# Patient Record
Sex: Female | Born: 1965 | Race: White | Hispanic: No | Smoking: Never smoker
Health system: Southern US, Community
[De-identification: ages and names within clinical notes are randomized; demographics above are authoritative.]

## PROBLEM LIST (undated history)

## (undated) DIAGNOSIS — O24419 Gestational diabetes mellitus in pregnancy, unspecified control: Secondary | ICD-10-CM

## (undated) DIAGNOSIS — T7840XA Allergy, unspecified, initial encounter: Secondary | ICD-10-CM

## (undated) DIAGNOSIS — E559 Vitamin D deficiency, unspecified: Secondary | ICD-10-CM

## (undated) DIAGNOSIS — E7800 Pure hypercholesterolemia, unspecified: Secondary | ICD-10-CM

## (undated) HISTORY — PX: CHOLECYSTECTOMY: SHX55

## (undated) HISTORY — DX: Vitamin D deficiency, unspecified: E55.9

## (undated) HISTORY — DX: Gestational diabetes mellitus in pregnancy, unspecified control: O24.419

## (undated) HISTORY — DX: Allergy, unspecified, initial encounter: T78.40XA

## (undated) HISTORY — PX: OVARIAN CYST REMOVAL: SHX89

## (undated) HISTORY — DX: Pure hypercholesterolemia, unspecified: E78.00

---

## 1998-10-16 ENCOUNTER — Emergency Department (HOSPITAL_COMMUNITY): Admission: EM | Admit: 1998-10-16 | Discharge: 1998-10-17 | Payer: Self-pay | Admitting: Emergency Medicine

## 1998-10-16 ENCOUNTER — Encounter: Payer: Self-pay | Admitting: Emergency Medicine

## 1998-10-18 ENCOUNTER — Inpatient Hospital Stay (HOSPITAL_COMMUNITY): Admission: AD | Admit: 1998-10-18 | Discharge: 1998-10-22 | Payer: Self-pay | Admitting: Psychiatry

## 1999-02-19 ENCOUNTER — Other Ambulatory Visit: Admission: RE | Admit: 1999-02-19 | Discharge: 1999-02-19 | Payer: Self-pay | Admitting: Obstetrics and Gynecology

## 1999-05-13 ENCOUNTER — Emergency Department (HOSPITAL_COMMUNITY): Admission: EM | Admit: 1999-05-13 | Discharge: 1999-05-13 | Payer: Self-pay | Admitting: Emergency Medicine

## 2001-09-14 ENCOUNTER — Other Ambulatory Visit: Admission: RE | Admit: 2001-09-14 | Discharge: 2001-09-14 | Payer: Self-pay | Admitting: Obstetrics and Gynecology

## 2003-04-30 DIAGNOSIS — O24419 Gestational diabetes mellitus in pregnancy, unspecified control: Secondary | ICD-10-CM

## 2003-04-30 HISTORY — DX: Gestational diabetes mellitus in pregnancy, unspecified control: O24.419

## 2003-06-04 ENCOUNTER — Inpatient Hospital Stay (HOSPITAL_COMMUNITY): Admission: AD | Admit: 2003-06-04 | Discharge: 2003-06-04 | Payer: Self-pay | Admitting: Gynecology

## 2003-07-21 ENCOUNTER — Other Ambulatory Visit: Admission: RE | Admit: 2003-07-21 | Discharge: 2003-07-21 | Payer: Self-pay | Admitting: Obstetrics and Gynecology

## 2003-07-30 ENCOUNTER — Inpatient Hospital Stay (HOSPITAL_COMMUNITY): Admission: AD | Admit: 2003-07-30 | Discharge: 2003-07-30 | Payer: Self-pay | Admitting: Obstetrics and Gynecology

## 2004-02-04 ENCOUNTER — Inpatient Hospital Stay (HOSPITAL_COMMUNITY): Admission: RE | Admit: 2004-02-04 | Discharge: 2004-02-04 | Payer: Self-pay | Admitting: Obstetrics and Gynecology

## 2004-07-31 ENCOUNTER — Encounter: Admission: RE | Admit: 2004-07-31 | Discharge: 2004-07-31 | Payer: Self-pay | Admitting: Obstetrics and Gynecology

## 2004-08-21 ENCOUNTER — Inpatient Hospital Stay (HOSPITAL_COMMUNITY): Admission: AD | Admit: 2004-08-21 | Discharge: 2004-08-23 | Payer: Self-pay | Admitting: Obstetrics and Gynecology

## 2004-08-28 ENCOUNTER — Inpatient Hospital Stay (HOSPITAL_COMMUNITY): Admission: AD | Admit: 2004-08-28 | Discharge: 2004-08-28 | Payer: Self-pay | Admitting: Obstetrics and Gynecology

## 2004-09-11 ENCOUNTER — Inpatient Hospital Stay (HOSPITAL_COMMUNITY): Admission: AD | Admit: 2004-09-11 | Discharge: 2004-09-11 | Payer: Self-pay | Admitting: Obstetrics and Gynecology

## 2004-10-01 ENCOUNTER — Ambulatory Visit: Payer: Self-pay | Admitting: *Deleted

## 2004-10-08 ENCOUNTER — Ambulatory Visit: Payer: Self-pay | Admitting: Obstetrics & Gynecology

## 2004-10-12 ENCOUNTER — Inpatient Hospital Stay (HOSPITAL_COMMUNITY): Admission: AD | Admit: 2004-10-12 | Discharge: 2004-10-13 | Payer: Self-pay | Admitting: Obstetrics and Gynecology

## 2004-10-15 ENCOUNTER — Inpatient Hospital Stay (HOSPITAL_COMMUNITY): Admission: AD | Admit: 2004-10-15 | Discharge: 2004-10-15 | Payer: Self-pay | Admitting: *Deleted

## 2004-10-17 ENCOUNTER — Inpatient Hospital Stay (HOSPITAL_COMMUNITY): Admission: AD | Admit: 2004-10-17 | Discharge: 2004-10-19 | Payer: Self-pay | Admitting: Obstetrics and Gynecology

## 2008-10-26 ENCOUNTER — Ambulatory Visit (HOSPITAL_COMMUNITY): Admission: RE | Admit: 2008-10-26 | Discharge: 2008-10-26 | Payer: Self-pay | Admitting: Obstetrics and Gynecology

## 2008-12-02 ENCOUNTER — Ambulatory Visit: Payer: Self-pay | Admitting: Family Medicine

## 2008-12-08 LAB — CONVERTED CEMR LAB
ALT: 17 units/L (ref 0–35)
AST: 20 units/L (ref 0–37)
Albumin: 4 g/dL (ref 3.5–5.2)
Alkaline Phosphatase: 39 units/L (ref 39–117)
BUN: 10 mg/dL (ref 6–23)
Basophils Absolute: 0 10*3/uL (ref 0.0–0.1)
Basophils Relative: 0.3 % (ref 0.0–3.0)
Bilirubin, Direct: 0.1 mg/dL (ref 0.0–0.3)
CO2: 29 meq/L (ref 19–32)
Calcium: 8.6 mg/dL (ref 8.4–10.5)
Chloride: 109 meq/L (ref 96–112)
Cholesterol: 169 mg/dL (ref 0–200)
Creatinine, Ser: 0.6 mg/dL (ref 0.4–1.2)
Eosinophils Absolute: 0.1 10*3/uL (ref 0.0–0.7)
Eosinophils Relative: 1.9 % (ref 0.0–5.0)
GFR calc non Af Amer: 116.64 mL/min (ref >60)
Glucose, Bld: 125 mg/dL — ABNORMAL HIGH (ref 70–99)
HCT: 36.5 % (ref 36.0–46.0)
HDL: 53.8 mg/dL (ref >39.00)
Hemoglobin: 12.4 g/dL (ref 12.0–15.0)
Hgb A1c MFr Bld: 5.7 % (ref 4.6–6.5)
LDL Cholesterol: 105 mg/dL — ABNORMAL HIGH (ref 0–99)
Lymphocytes Relative: 22.8 % (ref 12.0–46.0)
Lymphs Abs: 1.4 10*3/uL (ref 0.7–4.0)
MCHC: 33.8 g/dL (ref 30.0–36.0)
MCV: 84.7 fL (ref 78.0–100.0)
Monocytes Absolute: 0.2 10*3/uL (ref 0.1–1.0)
Monocytes Relative: 3.5 % (ref 3.0–12.0)
Neutro Abs: 4.6 10*3/uL (ref 1.4–7.7)
Neutrophils Relative %: 71.5 % (ref 43.0–77.0)
Platelets: 180 10*3/uL (ref 150.0–400.0)
Potassium: 3.9 meq/L (ref 3.5–5.1)
RBC: 4.31 M/uL (ref 3.87–5.11)
RDW: 12.8 % (ref 11.5–14.6)
Sodium: 141 meq/L (ref 135–145)
TSH: 0.53 microintl units/mL (ref 0.35–5.50)
Total Bilirubin: 0.6 mg/dL (ref 0.3–1.2)
Total CHOL/HDL Ratio: 3
Total Protein: 7.4 g/dL (ref 6.0–8.3)
Triglycerides: 49 mg/dL (ref 0.0–149.0)
VLDL: 9.8 mg/dL (ref 0.0–40.0)
WBC: 6.3 10*3/uL (ref 4.5–10.5)

## 2010-09-14 NOTE — H&P (Signed)
NAME:  SANAIA, JASSO               ACCOUNT NO.:  [id number]   MEDICAL RECORD NO.:  [id number]          PATIENT TYPE:  INP   LOCATION:  9153                          FACILITY:  [facility]   PHYSICIAN:  Richardean Sale, M.D.   DATE OF BIRTH:  1965/10/15   DATE OF ADMISSION:  08/21/2004  DATE OF DISCHARGE:                                HISTORY & PHYSICAL   ADMITTING DIAGNOSIS:  A 30-week twin intrauterine pregnancy with preterm  cervical change and contraction.   HISTORY OF PRESENT ILLNESS:  This is a 45 year old, gravida 2, para 0-0-1-0,  with a known diamniotic dichorionic twin intrauterine gestation at 66 weeks  and 3/7ths' days by first trimester ultrasound, who presented to the office  today for a routine exam.  The patient had denied any contractions, vaginal  bleeding, loss of fluid, and reported good fetal movement x 2.  On her  cervical check, however, she was found to be dilated to 1-cm, 70% effaced, -  1 station with baby A vertex.  The patient was subsequently sent to triage  at the hospital and was found to be contracting every three minutes.  She  did respond to terbutaline but given her contractions and dilation at 30  weeks' gestation, she is being admitted for bed rest and observation.  Pregnancy has been complicated by newly diagnosed gestational diabetes which  is well controlled with diet.  Ultrasound today revealed appropriate growth  for both twins.  Both twins at approximately the 45th percentile with baby A  vertex and baby B transverse.  Good amniotic fluid was noted for both.  Prenatal care has been at [facility] with Dr. Richardean Sale as the  primary attending.   PAST MEDICAL HISTORY:  No prior hospitalizations.   PAST SURGICAL HISTORY:  Hysteroscopy with removal of endometrial polyp and  D&C.   OBSTETRIC HISTORY:  1.  Gravida 1, six week TAB.  2.  Gravida 2, current pregnancy conceived with donor intrauterine      insemination secondary to female factor  infertility and injectable      gonadotropin hormones.   GYNECOLOGIC HISTORY:  1.  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  2.  No abnormal Pap smears or STDs.   SOCIAL HISTORY:  She is married.  Denies tobacco, alcohol, or drug use.   FAMILY HISTORY:  Significant for thyroid disease, diabetes, bipolar  disorder.  No known birth defects, congenital anomalies, spina bifida, Down  syndrome, or cystic fibrosis.   PHYSICAL EXAMINATION:  GENERAL:  She is a well-developed, well-nourished,  white female who is in no acute distress.  HEART:  Regular rate and rhythm.  LUNGS:  Clear.  ABDOMEN:  Gravid, soft, nontender.  Fundal height is 38.  EXTREMITIES:  No cyanosis, clubbing, or edema.  PELVIC:  Cervix is 1-cm, 70%, -1 station, vertex.  Tocometer tracing showed  contractions every three minutes which subsided after administration of  terbutaline subcu.   PRENATAL LABS:  Blood type is A positive.  Antibody screen negative.  RPR  nonreactive.  Rubella immune.  Hepatitis B surface antigen nonreactive.  HIV  nonreactive.  Three-hour Glucola abnormal.  First trimester screen within  normal limits at Dr. Eber Jones office.  Anatomy screen normal.   ASSESSMENT:  44.  A 45 year old, gravida 2, para 0-0-1-0, white female at 8 weeks and      3/7ths days' gestation with diamniotic dichorionic twin intrauterine      pregnancy with preterm contractions and early cervical change.  2.  Gestational diabetes.   PLAN:  1.  We will admit to antenatal for continuous fetal monitoring and bed rest.  2.  Tocolysis as needed.  3.  Betamethasone 12.5 mg IM x 1 now, repeat in 24 hours.  4.  Check Group Beta Strep culture, start ampicillin 1 gram IV q.4h. until      culture results available.  5.  Gestational diabetes, we will continue to check fasting blood sugars and      2 hours post prandial.  6.  I discussed plan with husband and patient who voice understanding and      need for hospitalization at  this time.  If no contractions overnight we      will check FFN in the morning and consider discharge to home if FFN      negative.      JW/MEDQ  D:  08/21/2004  T:  08/21/2004  Job:  [id number]

## 2010-09-14 NOTE — Consult Note (Signed)
NAME:  Jacqueline Rice, VESSEL NO.:  [id number]   MEDICAL RECORD NO.:  [id number]          PATIENT TYPE:  MAT   LOCATION:  MATC                          FACILITY:  [facility]   PHYSICIAN:  Lenoard Aden, M.D.DATE OF BIRTH:  08/18/1965   DATE OF CONSULTATION:  09/11/2004  DATE OF DISCHARGE:                                   CONSULTATION   EMERGENCY ROOM CONSULTATION:   CHIEF COMPLAINT:  Rule out preterm labor.   HISTORY OF PRESENT ILLNESS:  The patient is a 45 year old white female, G1,  P0, at 69 and 2/7ths weeks with twin intrauterine gestation with positive  fetal fibronectin today for rule out preterm labor, previously on Procardia  with questionable allergic response.   ALLERGIES:  She has no known obvious drug allergies.   MEDICATIONS:  Prenatal vitamins.   SOCIAL HISTORY:  She is a nonsmoker, nondrinker.  She denies domestic or  physical violence.   PAST MEDICAL/SURGICAL HISTORY:  1.  History of a TAB in 1988.  2.  Hysteroscopy with removed uterine polyp.  3.  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   FAMILY HISTORY:  Bipolar disorder, thyroid disease, diabetes, and myocardial  infarction.   PRENATAL LABORATORY DATA:  Unavailable at this time.   PRENATAL COURSE:  Complicated by preterm labor, status post hospitalization  and betamethasone.   PHYSICAL EXAMINATION:  VITAL SIGNS:  Stable.  The patient is afebrile.  LUNGS:  Clear.  HEART:  Regular rate and rhythm.  ABDOMEN:  Soft, gravid, nontender.  Fetal heart tones reactive x2.  Contractions are irregular status post subcutaneous terbutaline x1.  PELVIC:  Cervical exam is unchanged at 1 cm, 70%, vertex -1 and posterior.  EXTREMITIES:  No cords.  NEUROLOGIC:  Nonfocal.   IMPRESSION:  1.  A 33 and 2/7th week twin intrauterine pregnancy.  2.  Positive fetal fibronectin.  3.  No evidence of preterm cervical change.  The patient is status post      betamethasone.   PLAN:  Discharge home on preterm labor  warnings.  Follow up in the office in  48 hours.  Strict preterm labor precautions given.      RJT/MEDQ  D:  09/11/2004  T:  09/11/2004  Job:  [id number]   cc:   Ma Hillock

## 2010-09-14 NOTE — Discharge Summary (Signed)
NAME:  Jacqueline Rice, Jacqueline Rice               ACCOUNT NO.:  [id number]   MEDICAL RECORD NO.:  [id number]          PATIENT TYPE:  INP   LOCATION:  9153                          [facility]   PHYSICIAN:  Richardean Sale, M.D.   DATE OF BIRTH:  1966-03-27   DATE OF ADMISSION:  08/21/2004  DATE OF DISCHARGE:  08/23/2004                                 DISCHARGE SUMMARY   ADMISSION DIAGNOSIS:  30-week twin intrauterine pregnancy with preterm  cervical change and contractions.   DISCHARGE DIAGNOSIS:  30-week twin intrauterine pregnancy with preterm  cervical change and contractions.   HISTORY OF PRESENT ILLNESS:  This is a 45 year old gravida 2, para 0-0-1-0  white female with diamniotic-dichorionic twin intrauterine gestation at 30  weeks and 3/7 days who presented for a routine visit in the office.  She  denied any contractions, loss of fluid, or vaginal bleeding and reported  good movement x2.  On cervical exam, she was found to be dilated to 1 cm,  70% effaced, and -1 station, with baby A vertex.  The patient was  subsequently sent to the hospital for evaluation and was found to be  contracting every three minutes.  She was hospitalized and placed on bedrest  and received subcutaneous terbutaline for tocolysis on only two occasions.  An fFN was obtained which was negative, and the patient was observed for a  total of 48 hours and had had no further cervical change and no regular  contraction pattern. The patient was subsequently discharged to home on  August 23, 2004 in stable condition.  Fetal heart rate tracings were reactive  throughout the admission.  Group B beta strep culture was positive, and the  patient received 48 hours of IV antibiotics.   DISPOSITION:  To home.   CONDITION ON DISCHARGE:  Stable.   DISCHARGE INSTRUCTIONS:  The patient is to be on bedrest at home with pelvic  rest as well.  She is to call for greater than five contractions an hour  that are not relieved with the  use of oral hydration.  She was given a  prescription for Procardia 10 mg p.o. t.i.d. p.r.n. contractions greater  than six an hour.  She is to record daily fetal movement counts and to  contact the office immediately for decreased fetal movement, greater than  five contractions an hour that are not relieved with the above measures,  vaginal bleeding, loss of fluid, or any other unusual symptoms.   FOLLOW UP:  The patient will followup on Tuesday, May 2nd in the office.      JW/MEDQ  D:  08/25/2004  T:  08/26/2004  Job:  [id number]

## 2010-09-14 NOTE — H&P (Signed)
NAME:  Jacqueline Rice, Jacqueline Rice               ACCOUNT NO.:  [id number]   MEDICAL RECORD NO.:  [id number]          PATIENT TYPE:  INP   LOCATION:  9167                          [facility]   PHYSICIAN:  Richardean Sale, M.D.   DATE OF BIRTH:  1965-06-11   DATE OF ADMISSION:  10/17/2004  DATE OF DISCHARGE:                                HISTORY & PHYSICAL   ADMITTING DIAGNOSIS:  Thirty-eight-plus-weeks twin intrauterine gestation,  for induction of labor.   HISTORY OF PRESENT ILLNESS:  This is a 45 year old gravida 2 para 0-0-1-0  white female who is at 38+ weeks gestation with a due date of October 28, 2004  with a known diamniotic-dichorionic twin intrauterine gestation who presents  for induction of labor. The patient's pregnancy has been complicated by  known twin intrauterine gestation conceived with injectable gonadotropins  and donor sperm IUI. The patient was also diagnosed with gestational  diabetes this pregnancy but her blood sugars have been excellently  controlled with diet and she has not required any hyperglycemic medications  or insulin. The patient was admitted at [redacted] weeks gestation for preterm  cervical change, rule out preterm labor. She received intravenous ampicillin  at that time and subsequently developed a drug reaction in the form of a  rash. Growth to date has been concordant. Pregnancy has been otherwise  uncomplicated. Prenatal care has been at [facility] with Dr. Richardean Sale as the primary attending.   PAST OBSTETRIC HISTORY:  TAB x1.   GYNECOLOGIC HISTORY:  Positive for anovulatory infertility and female factor  infertility. No history of STDs or abnormal Pap smears. Status post  hysteroscopy with removal of uterine polyp.   PAST MEDICAL HISTORY:  No prior hospitalizations.   SURGICAL HISTORY:  Hysteroscopy with removal of uterine polyp, D&C.   SOCIAL HISTORY:  She is married. Denies tobacco, alcohol, or drug use.   FAMILY HISTORY:  Positive for diabetes  in grandparents and her mother,  thyroid disease in her mother, and bipolar disorder in her mother. No known  birth defects, congenital anomalies, Down's syndrome, spina bifida, cystic  fibrosis, or other chromsomal abnormalities.   MEDICATIONS:  Prenatal vitamins, folic acid.   ALLERGIES:  AMPICILLIN.   PHYSICAL EXAMINATION:  VITAL SIGNS:  She is afebrile, vital signs are  stable.  GENERAL:  She is a well-developed, well-nourished white female who is in no  acute distress.  HEART:  Regular rate and rhythm.  LUNGS:  Clear to auscultation bilaterally.  ABDOMEN:  Gravid, soft and nontender.  EXTREMITIES:  Trace edema in the feet bilaterally, nontender.  NEUROLOGIC:  Nonfocal.  PELVIC:  Cervix is 1-2 cm, 90%, 0 station, and vertex.   Ultrasound confirms baby A vertex, baby B is vertex oblique with the head in  the maternal right lower quadrant. Amniotic fluid index is normal for both.  Fetal heart rate tracing shows reactive nonstress test x2.   PRENATAL LABORATORY DATA:  Blood type A positive, antibody screen negative.  RPR nonreactive. Rubella immune. Hepatitis B surface antigen nonreactive.  HIV nonreactive. Hemoglobin 10.6, hematocrit 31.7 at 28 weeks. Glucose  challenge 1-hour test 144. Three-hour challenge showed fasting sugar 89, 1-  hour 197, 2-hour 197, 3-hour 132. Group B beta strep positive.   Consultation was obtained at the [facility] for advanced maternal  age with no abnormalities identified.   ASSESSMENT:  A 45 year old gravida 2 para 0-0-1-0 white female with a known  twin intrauterine gestation, diamniotic-dichorionic, who is now 38+ weeks  gestation for induction of labor.   PLAN:  1.  Will admit to labor and delivery.  2.  Continuous fetal monitoring.  3.  Will start low-dose Pitocin, perform amniotomy after antibiotics      administered.  4.  Group B beta strep positive with allergy to AMPICILLIN. Sensitivity      testing on group B beta strep  shows sensitive to clindamycin. Will      administer clindamycin per GBS protocol.  5.  Obtain ultrasound on admission to confirm presentation.  6.  I reviewed with the patient and husband increased risk of cesarean      section with twin intrauterine gestation. Recommend epidural for      analgesia in the event that emergent cesarean section has to occur. The      patient and husband voice understanding of these above risks and desire      to proceed. Informed consent was obtained. Anticipate attempts at      vaginal delivery.       JW/MEDQ  D:  10/17/2004  T:  10/17/2004  Job:  [id number]

## 2016-09-30 ENCOUNTER — Ambulatory Visit (INDEPENDENT_AMBULATORY_CARE_PROVIDER_SITE_OTHER): Payer: BLUE CROSS/BLUE SHIELD | Admitting: Family Medicine

## 2016-09-30 ENCOUNTER — Encounter: Payer: Self-pay | Admitting: Family Medicine

## 2016-09-30 VITALS — BP 118/76 | HR 86 | Temp 97.9°F | Ht 65.0 in | Wt 144.0 lb

## 2016-09-30 DIAGNOSIS — Z13220 Encounter for screening for lipoid disorders: Secondary | ICD-10-CM | POA: Diagnosis not present

## 2016-09-30 DIAGNOSIS — E559 Vitamin D deficiency, unspecified: Secondary | ICD-10-CM

## 2016-09-30 DIAGNOSIS — Z1231 Encounter for screening mammogram for malignant neoplasm of breast: Secondary | ICD-10-CM

## 2016-09-30 DIAGNOSIS — Z0000 Encounter for general adult medical examination without abnormal findings: Secondary | ICD-10-CM | POA: Diagnosis not present

## 2016-09-30 DIAGNOSIS — Z1239 Encounter for other screening for malignant neoplasm of breast: Secondary | ICD-10-CM

## 2016-09-30 LAB — CBC WITH DIFFERENTIAL/PLATELET
Basophils Absolute: 0 10*3/uL (ref 0.0–0.1)
Basophils Relative: 0.6 % (ref 0.0–3.0)
Eosinophils Absolute: 0.2 10*3/uL (ref 0.0–0.7)
Eosinophils Relative: 2.4 % (ref 0.0–5.0)
HCT: 39.8 % (ref 36.0–46.0)
Hemoglobin: 13.1 g/dL (ref 12.0–15.0)
Lymphocytes Relative: 22.7 % (ref 12.0–46.0)
Lymphs Abs: 1.5 10*3/uL (ref 0.7–4.0)
MCHC: 33 g/dL (ref 30.0–36.0)
MCV: 82.2 fl (ref 78.0–100.0)
Monocytes Absolute: 0.4 10*3/uL (ref 0.1–1.0)
Monocytes Relative: 5.3 % (ref 3.0–12.0)
Neutro Abs: 4.6 10*3/uL (ref 1.4–7.7)
Neutrophils Relative %: 69 % (ref 43.0–77.0)
Platelets: 244 10*3/uL (ref 150.0–400.0)
RBC: 4.85 Mil/uL (ref 3.87–5.11)
RDW: 14.1 % (ref 11.5–15.5)
WBC: 6.6 10*3/uL (ref 4.0–10.5)

## 2016-09-30 LAB — COMPREHENSIVE METABOLIC PANEL
ALT: 18 U/L (ref 0–35)
AST: 19 U/L (ref 0–37)
Albumin: 4.4 g/dL (ref 3.5–5.2)
Alkaline Phosphatase: 44 U/L (ref 39–117)
BUN: 14 mg/dL (ref 6–23)
CO2: 29 mEq/L (ref 19–32)
Calcium: 9.6 mg/dL (ref 8.4–10.5)
Chloride: 103 mEq/L (ref 96–112)
Creatinine, Ser: 0.86 mg/dL (ref 0.40–1.20)
GFR: 74.35 mL/min (ref >60.00)
Glucose, Bld: 96 mg/dL (ref 70–99)
Potassium: 4.4 mEq/L (ref 3.5–5.1)
Sodium: 138 mEq/L (ref 135–145)
Total Bilirubin: 0.6 mg/dL (ref 0.2–1.2)
Total Protein: 7.8 g/dL (ref 6.0–8.3)

## 2016-09-30 LAB — LIPID PANEL
Cholesterol: 209 mg/dL — ABNORMAL HIGH (ref 0–200)
HDL: 60.2 mg/dL (ref >39.00)
LDL Cholesterol: 132 mg/dL — ABNORMAL HIGH (ref 0–99)
NonHDL: 148.3
Total CHOL/HDL Ratio: 3
Triglycerides: 82 mg/dL (ref 0.0–149.0)
VLDL: 16.4 mg/dL (ref 0.0–40.0)

## 2016-09-30 LAB — VITAMIN D 25 HYDROXY (VIT D DEFICIENCY, FRACTURES): VITD: 14.55 ng/mL — ABNORMAL LOW (ref 30.00–100.00)

## 2016-09-30 NOTE — Progress Notes (Deleted)
Jacqueline Rice is a 51 y.o. female is here to [facility].   Patient Care Team: Briscoe Deutscher, DO as PCP - General (Family Medicine)   History of Present Illness:   Gertie Exon, CMA, acting as scribe for Dr. Juleen China.  HPI  Health Maintenance Due  Topic Date Due  . HIV Screening  02/17/1982  . PAP SMEAR  08/31/2010  . TETANUS/TDAP  08/28/2013     There is no immunization history on file for this patient.  PMHx, SurgHx, SocialHx, Medications, and Allergies were reviewed in the Visit Navigator and updated as appropriate.   No past medical history on file. No past surgical history on file. No family history on file. Social History  Substance Use Topics  . Smoking status: Never Smoker  . Smokeless tobacco: Never Used  . Alcohol use No    Current Medications and Allergies:   Current Outpatient Prescriptions:  .  Calcium-Magnesium-Vitamin D (CALCIUM 1200+D3 PO), Take by mouth., Disp: , Rfl:   Allergies  Allergen Reactions  . Amoxicillin   . Penicillins    Review of Systems:   Review of Systems  Constitutional: Negative for chills and fever.  HENT: Negative for congestion, ear pain and sore throat.   Respiratory: Negative for cough and shortness of breath.   Cardiovascular: Negative for chest pain and palpitations.  Gastrointestinal: Negative for abdominal pain, nausea and vomiting.  Genitourinary: Negative for frequency.  Musculoskeletal: Negative for back pain and neck pain.  Skin: Negative for rash.  Neurological: Negative for dizziness, loss of consciousness and headaches.  Endo/Heme/Allergies: Does not bruise/bleed easily.    Vitals:   Vitals:   09/30/16 0920  BP: 118/76  Pulse: 86  Temp: 97.9 F (36.6 C)  TempSrc: Oral  SpO2: 95%  Weight: 144 lb (65.3 kg)  Height: 5\' 5"  (1.651 m)     Body mass index is 23.96 kg/m.  Physical Exam:   Physical Exam  Results for orders placed or performed in visit on 12/02/08  [facility]    Result Value Ref Range   Cholesterol 169 0 - 200 mg/dL   Triglycerides 49.0 0.0 - 149.0 mg/dL   HDL 53.80 >39.00 mg/dL   VLDL 9.8 0.0 - 40.0 mg/dL   LDL Cholesterol 105 (H) 0 - 99 mg/dL   Total CHOL/HDL Ratio 3    Sodium 141 135 - 145 meq/L   Potassium 3.9 3.5 - 5.1 meq/L   Chloride 109 96 - 112 meq/L   CO2 29 19 - 32 meq/L   Glucose, Bld 125 (H) 70 - 99 mg/dL   BUN 10 6 - 23 mg/dL   Creatinine, Ser 0.6 0.4 - 1.2 mg/dL   Calcium 8.6 8.4 - 10.5 mg/dL   GFR calc non Af Amer 116.64 >60 mL/min   WBC 6.3 4.5 - 10.5 10*3/microliter   RBC 4.31 3.87 - 5.11 M/uL   Hemoglobin 12.4 12.0 - 15.0 g/dL   HCT 36.5 36.0 - 46.0 %   MCV 84.7 78.0 - 100.0 fL   MCHC 33.8 30.0 - 36.0 g/dL   RDW 12.8 11.5 - 14.6 %   Platelets 180.0 150.0 - 400.0 K/uL   Neutrophils Relative % 71.5 43.0 - 77.0 %   Lymphocytes Relative 22.8 12.0 - 46.0 %   Monocytes Relative 3.5 3.0 - 12.0 %   Eosinophils Relative 1.9 0.0 - 5.0 %   Basophils Relative 0.3 0.0 - 3.0 %   Neutro Abs 4.6 1.4 - 7.7 K/uL  Lymphs Abs 1.4 0.7 - 4.0 K/uL   Monocytes Absolute 0.2 0.1 - 1.0 K/uL   Eosinophils Absolute 0.1 0.0 - 0.7 K/uL   Basophils Absolute 0.0 0.0 - 0.1 K/uL   Total Bilirubin 0.6 0.3 - 1.2 mg/dL   Bilirubin, Direct 0.1 0.0 - 0.3 mg/dL   Alkaline Phosphatase 39 39 - 117 units/L   AST 20 0 - 37 units/L   ALT 17 0 - 35 units/L   Total Protein 7.4 6.0 - 8.3 g/dL   Albumin 4.0 3.5 - 5.2 g/dL   TSH 0.53 0.35 - 5.50 microintl units/mL   Hgb A1c MFr Bld 5.7 4.6 - 6.5 %    Assessment and Plan:   ***

## 2016-09-30 NOTE — Progress Notes (Signed)
Subjective:  Water quality scientist, CMA, acting as scribe for Dr. Juleen China.   Jacqueline Rice is a 51 y.o. female and is here for a comprehensive physical exam.  HPI  Health Maintenance Due  Topic Date Due  . HIV Screening  02/17/1982  . PAP SMEAR  08/31/2010  . TETANUS/TDAP  08/28/2013   PMHx, SurgHx, SocialHx, Medications, and Allergies were reviewed in the Visit Navigator and updated as appropriate.   History reviewed. No pertinent past medical history. History reviewed. No pertinent surgical history.  Family History  Problem Relation Age of Onset  . Diabetes Mother   . Mental illness Mother   . Mental illness Brother    Social History  Substance Use Topics  . Smoking status: Never Smoker  . Smokeless tobacco: Never Used  . Alcohol use No   Review of Systems:   Review of Systems  Constitutional: Negative for chills and fever.  HENT: Negative for congestion, ear pain and sore throat.   Eyes: Negative for blurred vision and pain.  Respiratory: Negative for cough and shortness of breath.   Cardiovascular: Negative for chest pain and palpitations.  Gastrointestinal: Negative for abdominal pain, nausea and vomiting.  Genitourinary: Negative for frequency.  Musculoskeletal: Negative for back pain and neck pain.  Skin: Negative for rash.  Neurological: Negative for dizziness, loss of consciousness and headaches.  Endo/Heme/Allergies: Does not bruise/bleed easily.  Psychiatric/Behavioral: Negative for depression. The patient is not nervous/anxious.    Objective:   BP 118/76   Pulse 86   Temp 97.9 F (36.6 C) (Oral)   Ht 5\' 5"  (1.651 m)   Wt 144 lb (65.3 kg)   LMP 09/29/2016   SpO2 95%   BMI 23.96 kg/m  Body mass index is 23.96 kg/m.  General Appearance:    Alert, cooperative, no distress, appears stated age  Head:    Normocephalic, without obvious abnormality, atraumatic  Eyes:    PERRL, conjunctiva/corneas clear, EOM's intact, fundi    benign, both eyes  Ears:     Normal TM's and external ear canals, both ears  Nose:   Nares normal, septum midline, mucosa normal, no drainage    or sinus tenderness  Throat:   Lips, mucosa, and tongue normal; teeth and gums normal  Neck:   Supple, symmetrical, trachea midline, no adenopathy; thyroid: no enlargement  Back:     Symmetric, no curvature, ROM normal, no CVA tenderness  Lungs:     Clear to auscultation bilaterally, respirations unlabored  Chest Wall:    No tenderness or deformity   Heart:    Regular rate and rhythm, S1 and S2 normal, no murmur, rub or gallop  Abdomen:     Soft, non-tender, bowel sounds active all four quadrants, no masses, no organomegaly  Extremities:   Extremities normal, atraumatic, no cyanosis or edema  Pulses:   2+ and symmetric all extremities  Skin:   Skin color, texture, turgor normal, no rashes or lesions  Lymph nodes:   Cervical, supraclavicular, and axillary nodes normal  Neurologic:   CNII-XII intact, normal strength, sensation and reflexes throughout   Assessment/Plan:   Jacqueline Rice was seen today for annual exam.  Diagnoses and all orders for this visit:  Encounter for routine history and physical exam in female -     CBC with Differential/Platelet -     Comprehensive metabolic panel  Vitamin D deficiency -     Vitamin D (25 hydroxy)  Screening for lipid disorders -     Lipid  panel  Screening for breast cancer -     MM SCREENING BREAST TOMO BILATERAL; Future   Patient Counseling: [x]    Nutrition: Stressed importance of moderation in sodium/caffeine intake, saturated fat and cholesterol, caloric balance, sufficient intake of fresh fruits, vegetables, fiber, calcium, iron, and 1 mg of folate supplement per day (for females capable of pregnancy).  [x]    Stressed the importance of regular exercise.   [x]    Substance Abuse: Discussed cessation/primary prevention of tobacco, alcohol, or other drug use; driving or other dangerous activities under the influence; availability of  treatment for abuse.   [x]    Injury prevention: Discussed safety belts, safety helmets, smoke detector, smoking near bedding or upholstery.   [x]    Sexuality: Discussed sexually transmitted diseases, partner selection, use of condoms, avoidance of unintended pregnancy  and contraceptive alternatives.  [x]    Dental health: Discussed importance of regular tooth brushing, flossing, and dental visits.  [x]    Health maintenance and immunizations reviewed. Please refer to Health maintenance section.   Briscoe Deutscher, DO [facility] served as Education administrator during this visit. History, Physical, and Plan performed by medical provider. The above documentation has been reviewed and is accurate and complete. Briscoe Deutscher, D.O.

## 2016-10-01 ENCOUNTER — Other Ambulatory Visit: Payer: Self-pay

## 2016-10-01 ENCOUNTER — Telehealth: Payer: Self-pay | Admitting: Family Medicine

## 2016-10-01 MED ORDER — VITAMIN D (ERGOCALCIFEROL) 1.25 MG (50000 UNIT) PO CAPS: 50000.0000 [IU] | ORAL_CAPSULE | ORAL | 0 refills | Status: DC

## 2016-10-01 NOTE — Telephone Encounter (Signed)
Patient returning Jacqueline Rice's call about lab results. Transferred call to Jacqueline Rice.

## 2016-10-01 NOTE — Telephone Encounter (Signed)
Pt is aware of results. 

## 2016-10-07 ENCOUNTER — Other Ambulatory Visit (HOSPITAL_COMMUNITY)
Admission: RE | Admit: 2016-10-07 | Discharge: 2016-10-07 | Disposition: A | Discharge status: Home / Self Care | Payer: BLUE CROSS/BLUE SHIELD | Source: Ambulatory Visit | Attending: Family Medicine | Admitting: Family Medicine

## 2016-10-07 ENCOUNTER — Ambulatory Visit (INDEPENDENT_AMBULATORY_CARE_PROVIDER_SITE_OTHER): Payer: BLUE CROSS/BLUE SHIELD | Admitting: Family Medicine

## 2016-10-07 ENCOUNTER — Encounter: Payer: Self-pay | Admitting: Family Medicine

## 2016-10-07 VITALS — BP 118/68 | HR 83 | Temp 98.3°F | Ht 65.0 in | Wt 145.8 lb

## 2016-10-07 DIAGNOSIS — Z01411 Encounter for gynecological examination (general) (routine) with abnormal findings: Secondary | ICD-10-CM | POA: Diagnosis present

## 2016-10-07 DIAGNOSIS — E559 Vitamin D deficiency, unspecified: Secondary | ICD-10-CM | POA: Diagnosis not present

## 2016-10-07 DIAGNOSIS — Z124 Encounter for screening for malignant neoplasm of cervix: Secondary | ICD-10-CM | POA: Diagnosis not present

## 2016-10-07 HISTORY — DX: Vitamin D deficiency, unspecified: E55.9

## 2016-10-07 NOTE — Progress Notes (Signed)
   Jacqueline Rice is a 51 y.o. female is here for follow up.  History of Present Illness:   Water quality scientist, CMA, acting as scribe for Dr. Juleen China.  HPI: Here for PAP only. No concerns.  Health Maintenance Due  Topic Date Due  . HIV Screening  02/17/1982  . PAP SMEAR  08/31/2010  . TETANUS/TDAP  08/28/2013    PMHx, SurgHx, SocialHx, FamHx, Medications, and Allergies were reviewed in the Visit Navigator and updated as appropriate.  There are no active problems to display for this patient.  Social History  Substance Use Topics  . Smoking status: Never Smoker  . Smokeless tobacco: Never Used  . Alcohol use No   Current Medications and Allergies:   Current Outpatient Prescriptions:  .  Calcium-Magnesium-Vitamin D (CALCIUM 1200+D3 PO), Take by mouth., Disp: , Rfl:  .  Vitamin D, Ergocalciferol, (DRISDOL) 50000 units CAPS capsule, Take 1 capsule (50,000 Units total) by mouth every 7 (seven) days., Disp: 12 capsule, Rfl: 0  Allergies  Allergen Reactions  . Amoxicillin   . Penicillins    Review of Systems   Review of Systems  Constitutional: Negative for chills and fever.  HENT: Negative for congestion, ear pain and sore throat.   Eyes: Negative for blurred vision and pain.  Respiratory: Negative for sputum production and shortness of breath.   Cardiovascular: Negative for chest pain and palpitations.  Gastrointestinal: Negative for abdominal pain, nausea and vomiting.  Genitourinary: Negative for frequency.  Musculoskeletal: Negative for back pain and neck pain.  Skin: Negative for rash.  Neurological: Negative for dizziness, loss of consciousness, weakness and headaches.  Endo/Heme/Allergies: Does not bruise/bleed easily.  Psychiatric/Behavioral: Negative for depression. The patient is not nervous/anxious.     Vitals:   Vitals:   10/07/16 0822  BP: 118/68  Pulse: 83  Temp: 98.3 F (36.8 C)  TempSrc: Oral  SpO2: 96%  Weight: 145 lb 12.8 oz (66.1 kg)  Height: 5'  5" (1.651 m)     Body mass index is 24.26 kg/m.   Physical Exam:   Physical Exam   Pelvic Exam Female: External genitalia: normal general appearance Urinary system: urethral meatus normal Vaginal: normal mucosa without prolapse or lesions Cervix: normal appearance Adnexa: normal bimanual exam Uterus: normal single, nontender Clinical staff offered to be present for exam: yes  Initials: AA.  Assessment and Plan:   [name] was seen today for follow-up.  Diagnoses and all orders for this visit:  Screening for cervical cancer -     Cytology - PAP   . Reviewed expectations re: course of current medical issues. . Discussed self-management of symptoms. . Outlined signs and symptoms indicating need for more acute intervention. . Patient verbalized understanding and all questions were answered. Marland Kitchen Health Maintenance issues including appropriate healthy diet, exercise, and smoking avoidance were discussed with patient. . See orders for this visit as documented in the electronic medical record. . Patient received an After Visit Summary.  CMA served as Education administrator during this visit. History, Physical, and Plan performed by medical provider. The above documentation has been reviewed and is accurate and complete. Briscoe Deutscher, D.O.  Briscoe Deutscher, DO [place] 10/07/2016  Future Appointments Date Time Provider [place]  09/30/2017 9:15 AM Briscoe Deutscher, DO [facility] None

## 2016-10-09 LAB — CYTOLOGY - PAP: Diagnosis: NEGATIVE

## 2016-10-14 ENCOUNTER — Other Ambulatory Visit: Payer: Self-pay | Admitting: Family Medicine

## 2016-10-14 DIAGNOSIS — Z1231 Encounter for screening mammogram for malignant neoplasm of breast: Secondary | ICD-10-CM

## 2016-10-22 ENCOUNTER — Ambulatory Visit
Admission: RE | Admit: 2016-10-22 | Discharge: 2016-10-22 | Disposition: A | Discharge status: Home / Self Care | Payer: BLUE CROSS/BLUE SHIELD | Source: Ambulatory Visit | Attending: Family Medicine | Admitting: Family Medicine

## 2016-10-22 DIAGNOSIS — Z1231 Encounter for screening mammogram for malignant neoplasm of breast: Secondary | ICD-10-CM

## 2016-12-21 ENCOUNTER — Other Ambulatory Visit: Payer: Self-pay | Admitting: Family Medicine

## 2016-12-21 NOTE — Telephone Encounter (Signed)
No. Tell patient to take vitamin D 1000 IU daily.

## 2016-12-21 NOTE — Telephone Encounter (Signed)
Do you want the patient to continue on the high dose Vitamin D?

## 2016-12-24 NOTE — Telephone Encounter (Signed)
Notified patient that she can start taking Vitamin D 1000 units daily. Patient verbalized understanding.

## 2017-09-05 DIAGNOSIS — L821 Other seborrheic keratosis: Secondary | ICD-10-CM | POA: Diagnosis not present

## 2017-09-29 NOTE — Progress Notes (Signed)
Subjective:    Jacqueline Rice is a 52 y.o. female and is here for a comprehensive physical exam.  Health Maintenance Due  Topic Date Due  . TETANUS/TDAP  08/28/2013  . COLONOSCOPY  02/17/2017   PMHx, SurgHx, SocialHx, Medications, and Allergies were reviewed in the Visit Navigator and updated as appropriate.   Past Medical History:  Diagnosis Date  . Pure hypercholesterolemia 09/30/2017  . Vitamin D deficiency 10/07/2016   No past surgical history on file.  Family History  Problem Relation Age of Onset  . Diabetes Mother   . Mental illness Mother   . Mental illness Brother   . Breast cancer Paternal Aunt    Social History   Tobacco Use  . Smoking status: Never Smoker  . Smokeless tobacco: Never Used  Substance Use Topics  . Alcohol use: No  . Drug use: No    Review of Systems:   Pertinent items are noted in the HPI. Otherwise, ROS is negative.  Objective:   BP 108/64   Pulse 76   Temp 98.4 F (36.9 C) (Oral)   Ht 5\' 5"  (1.651 m)   Wt 154 lb 3.2 oz (69.9 kg)   SpO2 98%   BMI 25.66 kg/m   Wt Readings from Last 3 Encounters:  09/30/17 154 lb 3.2 oz (69.9 kg)  10/07/16 145 lb 12.8 oz (66.1 kg)  09/30/16 144 lb (65.3 kg)     Ht Readings from Last 3 Encounters:  09/30/17 5\' 5"  (1.651 m)  10/07/16 5\' 5"  (1.651 m)  09/30/16 5\' 5"  (1.651 m)    General appearance: alert, cooperative and appears stated age. Head: normocephalic, without obvious abnormality, atraumatic. Neck: no adenopathy, supple, symmetrical, trachea midline; thyroid not enlarged, symmetric, no tenderness/mass/nodules. Lungs: clear to auscultation bilaterally. Heart: regular rate and rhythm Abdomen: soft, non-tender; no masses,  no organomegaly. Extremities: extremities normal, atraumatic, no cyanosis or edema. Skin: skin color, texture, turgor normal, no rashes or lesions. Lymph: cervical, supraclavicular, and axillary nodes normal; no abnormal inguinal nodes palpated. Neurologic: grossly  normal.  Assessment/Plan:   Jacqueline Rice was seen today for annual exam.  Diagnoses and all orders for this visit:  Routine physical examination  Pure hypercholesterolemia -     CBC with Differential/Platelet -     Comprehensive metabolic panel -     Lipid panel  Vitamin D deficiency -     VITAMIN D 25 Hydroxy (Vit-D Deficiency, Fractures)  Screening for malignant neoplasm of colon -     Ambulatory referral to Gastroenterology  Need for prophylactic vaccination against diphtheria-tetanus-pertussis (DTP) -     Tdap vaccine greater than or equal to 7yo IM    Patient Counseling: [x]    Nutrition: Stressed importance of moderation in sodium/caffeine intake, saturated fat and cholesterol, caloric balance, sufficient intake of fresh fruits, vegetables, fiber, calcium, iron, and 1 mg of folate supplement per day (for females capable of pregnancy).  [x]    Stressed the importance of regular exercise.   [x]    Substance Abuse: Discussed cessation/primary prevention of tobacco, alcohol, or other drug use; driving or other dangerous activities under the influence; availability of treatment for abuse.   [x]    Injury prevention: Discussed safety belts, safety helmets, smoke detector, smoking near bedding or upholstery.   [x]    Sexuality: Discussed sexually transmitted diseases, partner selection, use of condoms, avoidance of unintended pregnancy  and contraceptive alternatives.  [x]    Dental health: Discussed importance of regular tooth brushing, flossing, and dental visits.  [x]   Health maintenance and immunizations reviewed. Please refer to Health maintenance section.   Briscoe Deutscher, DO [place]

## 2017-09-30 ENCOUNTER — Encounter: Payer: Self-pay | Admitting: Family Medicine

## 2017-09-30 ENCOUNTER — Ambulatory Visit (INDEPENDENT_AMBULATORY_CARE_PROVIDER_SITE_OTHER): Payer: 59 | Admitting: Family Medicine

## 2017-09-30 VITALS — BP 108/64 | HR 76 | Temp 98.4°F | Ht 65.0 in | Wt 154.2 lb

## 2017-09-30 DIAGNOSIS — Z0000 Encounter for general adult medical examination without abnormal findings: Secondary | ICD-10-CM

## 2017-09-30 DIAGNOSIS — Z1211 Encounter for screening for malignant neoplasm of colon: Secondary | ICD-10-CM | POA: Diagnosis not present

## 2017-09-30 DIAGNOSIS — E559 Vitamin D deficiency, unspecified: Secondary | ICD-10-CM | POA: Diagnosis not present

## 2017-09-30 DIAGNOSIS — Z23 Encounter for immunization: Secondary | ICD-10-CM

## 2017-09-30 DIAGNOSIS — E7800 Pure hypercholesterolemia, unspecified: Secondary | ICD-10-CM | POA: Diagnosis not present

## 2017-09-30 LAB — CBC WITH DIFFERENTIAL/PLATELET
Basophils Absolute: 0 10*3/uL (ref 0.0–0.1)
Basophils Relative: 0.7 % (ref 0.0–3.0)
Eosinophils Absolute: 0.2 10*3/uL (ref 0.0–0.7)
Eosinophils Relative: 3.6 % (ref 0.0–5.0)
HCT: 39.6 % (ref 36.0–46.0)
Hemoglobin: 13.1 g/dL (ref 12.0–15.0)
Lymphocytes Relative: 30.9 % (ref 12.0–46.0)
Lymphs Abs: 1.6 10*3/uL (ref 0.7–4.0)
MCHC: 33 g/dL (ref 30.0–36.0)
MCV: 83 fl (ref 78.0–100.0)
Monocytes Absolute: 0.3 10*3/uL (ref 0.1–1.0)
Monocytes Relative: 5.7 % (ref 3.0–12.0)
Neutro Abs: 3 10*3/uL (ref 1.4–7.7)
Neutrophils Relative %: 59.1 % (ref 43.0–77.0)
Platelets: 246 10*3/uL (ref 150.0–400.0)
RBC: 4.77 Mil/uL (ref 3.87–5.11)
RDW: 13.4 % (ref 11.5–15.5)
WBC: 5 10*3/uL (ref 4.0–10.5)

## 2017-09-30 LAB — LIPID PANEL
Cholesterol: 211 mg/dL — ABNORMAL HIGH (ref 0–200)
HDL: 58.4 mg/dL (ref >39.00)
LDL Cholesterol: 136 mg/dL — ABNORMAL HIGH (ref 0–99)
NonHDL: 152.27
Total CHOL/HDL Ratio: 4
Triglycerides: 81 mg/dL (ref 0.0–149.0)
VLDL: 16.2 mg/dL (ref 0.0–40.0)

## 2017-09-30 LAB — VITAMIN D 25 HYDROXY (VIT D DEFICIENCY, FRACTURES): VITD: 17.89 ng/mL — ABNORMAL LOW (ref 30.00–100.00)

## 2017-09-30 LAB — COMPREHENSIVE METABOLIC PANEL
ALT: 16 U/L (ref 0–35)
AST: 15 U/L (ref 0–37)
Albumin: 4.5 g/dL (ref 3.5–5.2)
Alkaline Phosphatase: 51 U/L (ref 39–117)
BUN: 12 mg/dL (ref 6–23)
CO2: 29 mEq/L (ref 19–32)
Calcium: 9.3 mg/dL (ref 8.4–10.5)
Chloride: 103 mEq/L (ref 96–112)
Creatinine, Ser: 0.76 mg/dL (ref 0.40–1.20)
GFR: 85.41 mL/min (ref >60.00)
Glucose, Bld: 106 mg/dL — ABNORMAL HIGH (ref 70–99)
Potassium: 4.7 mEq/L (ref 3.5–5.1)
Sodium: 140 mEq/L (ref 135–145)
Total Bilirubin: 0.3 mg/dL (ref 0.2–1.2)
Total Protein: 7.2 g/dL (ref 6.0–8.3)

## 2017-10-03 MED ORDER — CHOLECALCIFEROL 1.25 MG (50000 UT) PO TABS: ORAL_TABLET | ORAL | 0 refills | Status: DC

## 2017-10-03 NOTE — Addendum Note (Signed)
Addended by: Briscoe Deutscher R on: 10/03/2017 01:39 PM   Modules accepted: Orders

## 2017-11-10 ENCOUNTER — Other Ambulatory Visit: Payer: Self-pay | Admitting: Family Medicine

## 2017-11-10 ENCOUNTER — Encounter: Payer: Self-pay | Admitting: Gastroenterology

## 2017-11-10 DIAGNOSIS — Z1231 Encounter for screening mammogram for malignant neoplasm of breast: Secondary | ICD-10-CM

## 2017-12-03 ENCOUNTER — Ambulatory Visit
Admission: RE | Admit: 2017-12-03 | Discharge: 2017-12-03 | Disposition: A | Discharge status: Home / Self Care | Payer: 59 | Source: Ambulatory Visit | Attending: Family Medicine | Admitting: Family Medicine

## 2017-12-03 DIAGNOSIS — Z1231 Encounter for screening mammogram for malignant neoplasm of breast: Secondary | ICD-10-CM | POA: Diagnosis not present

## 2017-12-04 ENCOUNTER — Other Ambulatory Visit: Payer: Self-pay | Admitting: Family Medicine

## 2017-12-04 DIAGNOSIS — R928 Other abnormal and inconclusive findings on diagnostic imaging of breast: Secondary | ICD-10-CM

## 2017-12-15 ENCOUNTER — Ambulatory Visit
Admission: RE | Admit: 2017-12-15 | Discharge: 2017-12-15 | Disposition: A | Discharge status: Home / Self Care | Payer: 59 | Source: Ambulatory Visit | Attending: Family Medicine | Admitting: Family Medicine

## 2017-12-15 DIAGNOSIS — R928 Other abnormal and inconclusive findings on diagnostic imaging of breast: Secondary | ICD-10-CM

## 2017-12-15 DIAGNOSIS — R922 Inconclusive mammogram: Secondary | ICD-10-CM | POA: Diagnosis not present

## 2017-12-15 DIAGNOSIS — N6310 Unspecified lump in the right breast, unspecified quadrant: Secondary | ICD-10-CM | POA: Diagnosis not present

## 2017-12-19 ENCOUNTER — Ambulatory Visit (AMBULATORY_SURGERY_CENTER): Payer: Self-pay | Admitting: *Deleted

## 2017-12-19 ENCOUNTER — Encounter: Payer: Self-pay | Admitting: Gastroenterology

## 2017-12-19 VITALS — Ht 65.0 in | Wt 155.0 lb

## 2017-12-19 DIAGNOSIS — Z1211 Encounter for screening for malignant neoplasm of colon: Secondary | ICD-10-CM

## 2017-12-19 MED ORDER — NA SULFATE-K SULFATE-MG SULF 17.5-3.13-1.6 GM/177ML PO SOLN: 1.0000 | Freq: Once | ORAL | 0 refills | Status: AC

## 2017-12-19 NOTE — Progress Notes (Signed)
Patient denies any allergies to eggs or soy. Patient denies any problems with anesthesia/sedation. Patient denies any oxygen use at home. Patient denies taking any diet/weight loss medications or blood thinners. EMMI education assisgned to patient on colonoscopy, this was explained and instructions given to patient. Suprep $15 coupon given to pt during pv.

## 2017-12-31 ENCOUNTER — Telehealth: Payer: Self-pay | Admitting: Gastroenterology

## 2018-01-02 ENCOUNTER — Encounter: Payer: 59 | Admitting: Gastroenterology

## 2018-02-23 ENCOUNTER — Encounter: Payer: Self-pay | Admitting: Gastroenterology

## 2018-02-23 ENCOUNTER — Ambulatory Visit (AMBULATORY_SURGERY_CENTER): Payer: 59 | Admitting: Gastroenterology

## 2018-02-23 VITALS — BP 101/64 | HR 72 | Temp 97.8°F | Resp 15 | Ht 65.0 in | Wt 154.0 lb

## 2018-02-23 DIAGNOSIS — D128 Benign neoplasm of rectum: Secondary | ICD-10-CM

## 2018-02-23 DIAGNOSIS — D127 Benign neoplasm of rectosigmoid junction: Secondary | ICD-10-CM | POA: Diagnosis not present

## 2018-02-23 DIAGNOSIS — K635 Polyp of colon: Secondary | ICD-10-CM | POA: Diagnosis not present

## 2018-02-23 DIAGNOSIS — K621 Rectal polyp: Secondary | ICD-10-CM

## 2018-02-23 DIAGNOSIS — D125 Benign neoplasm of sigmoid colon: Secondary | ICD-10-CM

## 2018-02-23 DIAGNOSIS — Z1211 Encounter for screening for malignant neoplasm of colon: Secondary | ICD-10-CM

## 2018-02-23 MED ORDER — SODIUM CHLORIDE 0.9 % IV SOLN: 500.0000 mL | Freq: Once | INTRAVENOUS | Status: DC

## 2018-02-23 NOTE — Progress Notes (Signed)
To PACU, VSS. Report to [email or web address] 

## 2018-02-23 NOTE — Patient Instructions (Signed)
Discharge instructions given. Handout on polyps. Resume previous medications. YOU HAD AN ENDOSCOPIC PROCEDURE TODAY AT THE [facility]:   Refer to the procedure report that was given to you for any specific questions about what was found during the examination.  If the procedure report does not answer your questions, please call your gastroenterologist to clarify.  If you requested that your care partner not be given the details of your procedure findings, then the procedure report has been included in a sealed envelope for you to review at your convenience later.  YOU SHOULD EXPECT: Some feelings of bloating in the abdomen. Passage of more gas than usual.  Walking can help get rid of the air that was put into your GI tract during the procedure and reduce the bloating. If you had a lower endoscopy (such as a colonoscopy or flexible sigmoidoscopy) you may notice spotting of blood in your stool or on the toilet paper. If you underwent a bowel prep for your procedure, you may not have a normal bowel movement for a few days.  Please Note:  You might notice some irritation and congestion in your nose or some drainage.  This is from the oxygen used during your procedure.  There is no need for concern and it should clear up in a day or so.  SYMPTOMS TO REPORT IMMEDIATELY:   Following lower endoscopy (colonoscopy or flexible sigmoidoscopy):  Excessive amounts of blood in the stool  Significant tenderness or worsening of abdominal pains  Swelling of the abdomen that is new, acute  Fever of 100F or higher   For urgent or emergent issues, a gastroenterologist can be reached at any hour by calling [phone].   DIET:  We do recommend a small meal at first, but then you may proceed to your regular diet.  Drink plenty of fluids but you should avoid alcoholic beverages for 24 hours.  ACTIVITY:  You should plan to take it easy for the rest of today and you should NOT DRIVE or use heavy  machinery until tomorrow (because of the sedation medicines used during the test).    FOLLOW UP: Our staff will call the number listed on your records the next business day following your procedure to check on you and address any questions or concerns that you may have regarding the information given to you following your procedure. If we do not reach you, we will leave a message.  However, if you are feeling well and you are not experiencing any problems, there is no need to return our call.  We will assume that you have returned to your regular daily activities without incident.  If any biopsies were taken you will be contacted by phone or by letter within the next 1-3 weeks.  Please call us at [phone] if you have not heard about the biopsies in 3 weeks.    SIGNATURES/CONFIDENTIALITY: You and/or your care partner have signed paperwork which will be entered into your electronic medical record.  These signatures attest to the fact that that the information above on your After Visit Summary has been reviewed and is understood.  Full responsibility of the confidentiality of this discharge information lies with you and/or your care-partner. 

## 2018-02-23 NOTE — Progress Notes (Signed)
Called to room to assist during endoscopic procedure.  Patient ID and intended procedure confirmed with present staff. Received instructions for my participation in the procedure from the performing physician.  

## 2018-02-23 NOTE — Op Note (Signed)
[facility] Patient Name: Jacqueline Rice Procedure Date: 02/23/2018 11:23 AM MRN: [id number] Endoscopist: Gerrit Heck , MD Age: 52 Referring MD:  Date of Birth: [birth year] Gender: Female Account #: [id number] Procedure:                Colonoscopy Indications:              Screening for colorectal malignant neoplasm, This                            is the patient's first colonoscopy Medicines:                Monitored Anesthesia Care Procedure:                Pre-Anesthesia Assessment:                           - Prior to the procedure, a History and Physical                            was performed, and patient medications and                            allergies were reviewed. The patient's tolerance of                            previous anesthesia was also reviewed. The risks                            and benefits of the procedure and the sedation                            options and risks were discussed with the patient.                            All questions were answered, and informed consent                            was obtained. Prior Anticoagulants: The patient has                            taken no previous anticoagulant or antiplatelet                            agents. ASA Grade Assessment: II - A patient with                            mild systemic disease. After reviewing the risks                            and benefits, the patient was deemed in                            satisfactory condition to undergo the procedure.  After obtaining informed consent, the colonoscope                            was passed under direct vision. Throughout the                            procedure, the patient's blood pressure, pulse, and                            oxygen saturations were monitored continuously. The                            Colonoscope was introduced through the anus and                            advanced to the the  terminal ileum. The colonoscopy                            was performed without difficulty. The patient                            tolerated the procedure well. The quality of the                            bowel preparation was adequate. Scope In: 11:32:19 AM Scope Out: 11:49:50 AM Scope Withdrawal Time: 0 hours 10 minutes 40 seconds  Total Procedure Duration: 0 hours 17 minutes 31 seconds  Findings:                 The perianal and digital rectal examinations were                            normal.                           Retroflexion in the right colon was performed.                           Two sessile polyps were found in the rectum and                            sigmoid colon. The polyps were 1 to 3 mm in size.                            These polyps were removed with a cold biopsy                            forceps. Resection and retrieval were complete.                            Estimated blood loss was minimal.                           The exam was otherwise normal throughout the  examined colon.                           The retroflexed view of the distal rectum and anal                            verge was normal and showed no anal or rectal                            abnormalities.                           The terminal ileum appeared normal. Complications:            No immediate complications. Estimated Blood Loss:     Estimated blood loss was minimal. Impression:               - Two 1 to 3 mm polyps in the rectum and in the                            sigmoid colon, removed with a cold biopsy forceps.                            Resected and retrieved.                           - The distal rectum and anal verge are normal on                            retroflexion view.                           - The examined portion of the ileum was normal. Recommendation:           - Patient has a contact number available for                             emergencies. The signs and symptoms of potential                            delayed complications were discussed with the                            patient. Return to normal activities tomorrow.                            Written discharge instructions were provided to the                            patient.                           - Resume previous diet today.                           - Continue present medications.                           -  Await pathology results.                           - Repeat colonoscopy in 3 - 5 years for                            surveillance based on pathology results.                           - Return to GI office PRN. Gerrit Heck, MD 02/23/2018 12:00:21 PM

## 2018-02-24 ENCOUNTER — Telehealth: Payer: Self-pay

## 2018-02-24 NOTE — Telephone Encounter (Signed)
  Follow up Call-  Call back number 02/23/2018  Post procedure Call Back phone  # [phone]  Permission to leave phone message Yes  Some recent data might be hidden     Patient questions:  Do you have a fever, pain , or abdominal swelling? No. Pain Score  0 *  Have you tolerated food without any problems? Yes.    Have you been able to return to your normal activities? Yes.    Do you have any questions about your discharge instructions: Diet   No. Medications  No. Follow up visit  No.  Do you have questions or concerns about your Care? No.  Actions: * If pain score is 4 or above: No action needed, pain <4.

## 2018-02-27 ENCOUNTER — Encounter: Payer: Self-pay | Admitting: Gastroenterology

## 2018-10-01 NOTE — Progress Notes (Signed)
Subjective:    Jacqueline Rice is a 53 y.o. female and is here for a comprehensive physical exam.  Right facial pain around eye, intermittent, sharp and sometimes numb with any touch. Last 5-10 minutes and then goes away. Started at occiput, which has improved in that area. No lesions at any point. No vision changes, but endorses dry eyes and skin. No Hx of the same. No HA, dizziness. No treatment. Has not seen eye doctor.   There are no preventive care reminders to display for this patient.   Current Outpatient Medications:  .  Ascorbic Acid (VITAMIN C PO), Take by mouth., Disp: , Rfl:  .  cetirizine (ZYRTEC) 10 MG tablet, Take 10 mg by mouth daily., Disp: , Rfl:  .  Cyanocobalamin (VITAMIN B 12 PO), Take by mouth., Disp: , Rfl:  .  fluticasone (FLONASE) 50 MCG/ACT nasal spray, Place into both nostrils daily., Disp: , Rfl:  .  VITAMIN D, ERGOCALCIFEROL, PO, Take by mouth., Disp: , Rfl:   PMHx, SurgHx, SocialHx, Medications, and Allergies were reviewed in the Visit Navigator and updated as appropriate.   Past Medical History:  Diagnosis Date  . Allergy   . Gestational diabetes 2005  . Pure hypercholesterolemia 09/30/2017  . Vitamin D deficiency 10/07/2016     Past Surgical History:  Procedure Laterality Date  . OVARIAN CYST REMOVAL  20 years ago     Family History  Problem Relation Age of Onset  . Diabetes Mother   . Mental illness Mother   . Mental illness Brother   . Breast cancer Paternal Aunt   . Colon cancer Neg Hx   . Esophageal cancer Neg Hx   . Rectal cancer Neg Hx   . Stomach cancer Neg Hx     Social History   Tobacco Use  . Smoking status: Never Smoker  . Smokeless tobacco: Never Used  Substance Use Topics  . Alcohol use: No  . Drug use: No    Review of Systems:   Pertinent items are noted in the HPI. Otherwise, ROS is negative.  Objective:   BP 98/72 (BP Location: Left Arm, Patient Position: Sitting, Cuff Size: Normal)   Pulse 76   Temp 98.5  F (36.9 C) (Oral)   Ht 5\' 5"  (1.651 m)   Wt 159 lb (72.1 kg)   LMP 09/11/2018 (Approximate)   SpO2 98%   BMI 26.46 kg/m   General appearance: alert, cooperative and appears stated age. Head: normocephalic, without obvious abnormality, atraumatic. Neck: no adenopathy, supple, symmetrical, trachea midline; thyroid not enlarged, symmetric, no tenderness/mass/nodules. Lungs: clear to auscultation bilaterally. Heart: regular rate and rhythm Abdomen: soft, non-tender; no masses,  no organomegaly. Extremities: extremities normal, atraumatic, no cyanosis or edema. Skin: skin color, texture, turgor normal, no rashes or lesions. Lymph: cervical, supraclavicular, and axillary nodes normal; no abnormal inguinal nodes palpated. Neurologic: grossly normal.  Assessment/Plan:   Jacqueline Rice was seen today for annual exam.  Diagnoses and all orders for this visit:  Routine physical examination -     TSH -     T4, free  Pure hypercholesterolemia -     CBC with Differential/Platelet -     Comprehensive metabolic panel -     Lipid panel -     C-reactive protein  Insulin resistance -     Hemoglobin A1c  Vitamin D deficiency -     VITAMIN D 25 Hydroxy (Vit-D Deficiency, Fractures)  Eye pain, right -     Ambulatory referral  to Ophthalmology   Patient Counseling: [x]    Nutrition: Stressed importance of moderation in sodium/caffeine intake, saturated fat and cholesterol, caloric balance, sufficient intake of fresh fruits, vegetables, fiber, calcium, iron, and 1 mg of folate supplement per day (for females capable of pregnancy).  [x]    Stressed the importance of regular exercise.   [x]    Substance Abuse: Discussed cessation/primary prevention of tobacco, alcohol, or other drug use; driving or other dangerous activities under the influence; availability of treatment for abuse.   [x]    Injury prevention: Discussed safety belts, safety helmets, smoke detector, smoking near bedding or upholstery.   [x]     Sexuality: Discussed sexually transmitted diseases, partner selection, use of condoms, avoidance of unintended pregnancy  and contraceptive alternatives.  [x]    Dental health: Discussed importance of regular tooth brushing, flossing, and dental visits.  [x]    Health maintenance and immunizations reviewed. Please refer to Health maintenance section.   Briscoe Deutscher, DO [employer]

## 2018-10-02 ENCOUNTER — Ambulatory Visit (INDEPENDENT_AMBULATORY_CARE_PROVIDER_SITE_OTHER): Payer: 59 | Admitting: Family Medicine

## 2018-10-02 ENCOUNTER — Encounter: Payer: Self-pay | Admitting: Family Medicine

## 2018-10-02 ENCOUNTER — Other Ambulatory Visit: Payer: Self-pay

## 2018-10-02 VITALS — BP 98/72 | HR 76 | Temp 98.5°F | Ht 65.0 in | Wt 159.0 lb

## 2018-10-02 DIAGNOSIS — E559 Vitamin D deficiency, unspecified: Secondary | ICD-10-CM

## 2018-10-02 DIAGNOSIS — Z0000 Encounter for general adult medical examination without abnormal findings: Secondary | ICD-10-CM | POA: Diagnosis not present

## 2018-10-02 DIAGNOSIS — E8881 Metabolic syndrome: Secondary | ICD-10-CM

## 2018-10-02 DIAGNOSIS — R7303 Prediabetes: Secondary | ICD-10-CM | POA: Insufficient documentation

## 2018-10-02 DIAGNOSIS — E88819 Insulin resistance, unspecified: Secondary | ICD-10-CM

## 2018-10-02 DIAGNOSIS — H5711 Ocular pain, right eye: Secondary | ICD-10-CM

## 2018-10-02 DIAGNOSIS — E7800 Pure hypercholesterolemia, unspecified: Secondary | ICD-10-CM

## 2018-10-02 LAB — COMPREHENSIVE METABOLIC PANEL
ALT: 11 U/L (ref 0–35)
AST: 10 U/L (ref 0–37)
Albumin: 4.2 g/dL (ref 3.5–5.2)
Alkaline Phosphatase: 45 U/L (ref 39–117)
BUN: 15 mg/dL (ref 6–23)
CO2: 27 mEq/L (ref 19–32)
Calcium: 8.8 mg/dL (ref 8.4–10.5)
Chloride: 103 mEq/L (ref 96–112)
Creatinine, Ser: 0.8 mg/dL (ref 0.40–1.20)
GFR: 75.44 mL/min (ref >60.00)
Glucose, Bld: 99 mg/dL (ref 70–99)
Potassium: 4.5 mEq/L (ref 3.5–5.1)
Sodium: 137 mEq/L (ref 135–145)
Total Bilirubin: 0.5 mg/dL (ref 0.2–1.2)
Total Protein: 7 g/dL (ref 6.0–8.3)

## 2018-10-02 LAB — CBC WITH DIFFERENTIAL/PLATELET
Basophils Absolute: 0 10*3/uL (ref 0.0–0.1)
Basophils Relative: 0.6 % (ref 0.0–3.0)
Eosinophils Absolute: 0.1 10*3/uL (ref 0.0–0.7)
Eosinophils Relative: 1.8 % (ref 0.0–5.0)
HCT: 39 % (ref 36.0–46.0)
Hemoglobin: 13.1 g/dL (ref 12.0–15.0)
Lymphocytes Relative: 24.7 % (ref 12.0–46.0)
Lymphs Abs: 1.6 10*3/uL (ref 0.7–4.0)
MCHC: 33.5 g/dL (ref 30.0–36.0)
MCV: 82.5 fl (ref 78.0–100.0)
Monocytes Absolute: 0.4 10*3/uL (ref 0.1–1.0)
Monocytes Relative: 5.7 % (ref 3.0–12.0)
Neutro Abs: 4.4 10*3/uL (ref 1.4–7.7)
Neutrophils Relative %: 67.2 % (ref 43.0–77.0)
Platelets: 215 10*3/uL (ref 150.0–400.0)
RBC: 4.72 Mil/uL (ref 3.87–5.11)
RDW: 14.1 % (ref 11.5–15.5)
WBC: 6.5 10*3/uL (ref 4.0–10.5)

## 2018-10-02 LAB — LIPID PANEL
Cholesterol: 203 mg/dL — ABNORMAL HIGH (ref 0–200)
HDL: 61.9 mg/dL (ref >39.00)
LDL Cholesterol: 118 mg/dL — ABNORMAL HIGH (ref 0–99)
NonHDL: 140.64
Total CHOL/HDL Ratio: 3
Triglycerides: 115 mg/dL (ref 0.0–149.0)
VLDL: 23 mg/dL (ref 0.0–40.0)

## 2018-10-02 LAB — VITAMIN D 25 HYDROXY (VIT D DEFICIENCY, FRACTURES): VITD: 22.82 ng/mL — ABNORMAL LOW (ref 30.00–100.00)

## 2018-10-02 LAB — C-REACTIVE PROTEIN: CRP: <1 mg/dL (ref 0.5–20.0)

## 2018-10-02 LAB — HEMOGLOBIN A1C: Hgb A1c MFr Bld: 6 % (ref 4.6–6.5)

## 2018-10-02 LAB — T4, FREE: Free T4: 0.8 ng/dL (ref 0.60–1.60)

## 2018-10-02 LAB — TSH: TSH: 1.1 u[IU]/mL (ref 0.35–4.50)

## 2018-10-05 ENCOUNTER — Encounter: Payer: Self-pay | Admitting: Family Medicine

## 2018-10-12 ENCOUNTER — Encounter: Payer: Self-pay | Admitting: Physician Assistant

## 2018-10-12 ENCOUNTER — Telehealth: Payer: Self-pay | Admitting: Neurology

## 2018-10-12 DIAGNOSIS — H5710 Ocular pain, unspecified eye: Secondary | ICD-10-CM | POA: Insufficient documentation

## 2018-10-12 NOTE — Telephone Encounter (Signed)
Pt gave consent for VV on the phone/ Pt understands that although there may be some limitations with this type of visit, we will take all precautions to reduce any security or privacy concerns.  Pt understands that this will be treated like an in office visit and we will file with pt's insurance, and there may be a patient responsible charge related to this service. Sent email w link to [email or web address]

## 2018-10-13 ENCOUNTER — Encounter: Payer: Self-pay | Admitting: Neurology

## 2018-10-13 NOTE — Telephone Encounter (Signed)
I reached out to the pt and completed the pre charting for 10/15/18 virtual visit.

## 2018-10-14 ENCOUNTER — Ambulatory Visit: Payer: 59 | Admitting: Neurology

## 2018-10-15 ENCOUNTER — Ambulatory Visit (INDEPENDENT_AMBULATORY_CARE_PROVIDER_SITE_OTHER): Payer: 59 | Admitting: Neurology

## 2018-10-15 ENCOUNTER — Encounter: Payer: Self-pay | Admitting: Neurology

## 2018-10-15 ENCOUNTER — Other Ambulatory Visit: Payer: Self-pay

## 2018-10-15 DIAGNOSIS — R202 Paresthesia of skin: Secondary | ICD-10-CM | POA: Diagnosis not present

## 2018-10-15 MED ORDER — GABAPENTIN 100 MG PO CAPS: 300.0000 mg | ORAL_CAPSULE | Freq: Three times a day (TID) | ORAL | 6 refills | Status: DC

## 2018-10-15 NOTE — Progress Notes (Signed)
PATIENT: Jacqueline Rice DOB: 28-Apr-1966  Virtual Visit via video  I connected with Jacqueline Rice on 10/15/18 at  by video and verified that I am speaking with the correct person using two identifiers.   I discussed the limitations, risks, security and privacy concerns of performing an evaluation and management service by video and the availability of in person appointments. I also discussed with the patient that there may be a patient responsible charge related to this service. The patient expressed understanding and agreed to proceed.  HISTORICAL  Jacqueline Rice is a 53 year old female, seen in request by her primary care physician Dr. Briscoe Deutscher ophthalmologist Dr. Katy Fitch, [name], for evaluation of right facial discomfort  I have reviewed and summarized the referring note from the referring physician.  She had a history of chickenpox, but never had a history of shingles, over the past few years, she had intermittent right forehead above right eyelid area discomfort, usually short lasting for few weeks it would go away, never had rash broke out  But this time she had similar sensation since March 2020, been ongoing for 3 months, it is painful above right eyelid, extending to right forehead, sensitive to touch, sometimes sharp radiating pain last 5 to 10 minutes, spare right occipital region, the skin is sensitive to touch, sometimes greeting her eyes, shampooing her hair would trigger the pain, she complains of dry eyes, was seen by Dr. Carolynn Sayers, will reported normal,  Laboratory evaluation June 2020 showed normal TSH, C-reactive protein, A1c of 6.0, vitamin D of 22, lipid profile showed cholesterol of 203, LDL of 118, normal CMP, CBC  Observations/Objective: I have reviewed problem lists, medications, allergies.  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 The patient was provided an opportunity to ask questions and all were answered. The patient agreed with the plan and demonstrated an understanding of the instructions.   The patient was advised to call back or seek an in-person evaluation if the symptoms worsen or if the condition fails to improve as anticipated.  I provided 30 minutes of non-face-to-face time during this encounter.  REVIEW OF SYSTEMS: Full 14 system review of systems performed and notable only for as above All other review of systems were negative.  ALLERGIES: Allergies  Allergen Reactions   Amoxicillin Nausea And Vomiting   Penicillins Nausea And Vomiting    HOME MEDICATIONS: Current Outpatient Medications  Medication Sig Dispense Refill   Ascorbic Acid (VITAMIN C PO) Take by mouth.     cetirizine (ZYRTEC) 10 MG tablet Take 10 mg by mouth daily.     Cyanocobalamin (VITAMIN B 12 PO) Take by mouth.     fluticasone (FLONASE) 50 MCG/ACT nasal spray Place into both nostrils daily.     VITAMIN D, ERGOCALCIFEROL, PO Take by mouth.     No current facility-administered medications for this visit.     PAST MEDICAL HISTORY: Past Medical History:  Diagnosis Date   Allergy    Gestational diabetes  2005   Pure hypercholesterolemia 09/30/2017   Vitamin D deficiency 10/07/2016    PAST SURGICAL HISTORY: Past Surgical History:  Procedure Laterality Date   CHOLECYSTECTOMY     OVARIAN CYST REMOVAL  20 years ago    FAMILY HISTORY: Family History  Problem Relation Age of Onset   Diabetes Mother    Mental illness  Mother    High blood pressure Father    High Cholesterol Father    Mental illness Brother    Breast cancer Paternal Aunt    Colon cancer Neg Hx    Esophageal cancer Neg Hx    Rectal cancer Neg Hx    Stomach cancer Neg Hx     SOCIAL HISTORY:   Social History   Socioeconomic History   Marital status: Married    Spouse name: Not on file   Number of children: Not on file   Years of education: Not on file   Highest education level: Not on file  Occupational History   Occupation: Armed forces training and education officer: [employer]  Social Needs   Financial resource strain: Not on file   Food insecurity    Worry: Not on file    Inability: Not on file   Transportation needs    Medical: Not on file    Non-medical: Not on file  Tobacco Use   Smoking status: Never Smoker   Smokeless tobacco: Never Used  Substance and Sexual Activity   Alcohol use: No   Drug use: No   Sexual activity: Yes  Lifestyle   Physical activity    Days per week: Not on file    Minutes per session: Not on file   Stress: Not on file  Relationships   Social connections    Talks on phone: Not on file    Gets together: Not on file    Attends religious service: Not on file    Active member of club or organization: Not on file    Attends meetings of clubs or organizations: Not on file    Relationship status: Not on file   Intimate partner violence    Fear of current or ex partner: Not on file    Emotionally abused: Not on file    Physically abused: Not on file    Forced sexual activity: Not on file  Other Topics Concern   Not on file  Social History Narrative   Right handed    Caffeine 1-2 cups per week    Marcial Pacas, M.D. Ph.D.  [facility] [address] [place], [place] [address] Ph: [phone] Fax: [phone]  CC: Briscoe Deutscher, DO, Katy Fitch, [name], MD

## 2018-10-20 ENCOUNTER — Telehealth: Payer: Self-pay | Admitting: Neurology

## 2018-10-20 NOTE — Telephone Encounter (Signed)
[employer] pending faxed notes 

## 2018-10-21 NOTE — Telephone Encounter (Signed)
I checked the status and it is still pending they did receive my fax.

## 2018-10-26 NOTE — Telephone Encounter (Signed)
no to the covid-19 questions MR Brain w/wo contrast Dr. Krista Blue [facility] Auth: [id number] (exp. 10/22/18 to 12/21/18). Patient is scheduled at [facility] for 10/28/18.

## 2018-10-28 ENCOUNTER — Other Ambulatory Visit: Payer: Self-pay

## 2018-10-28 ENCOUNTER — Ambulatory Visit: Payer: 59

## 2018-10-28 DIAGNOSIS — R202 Paresthesia of skin: Secondary | ICD-10-CM

## 2018-10-28 MED ORDER — GADOBENATE DIMEGLUMINE 529 MG/ML IV SOLN
15.0000 mL | Freq: Once | INTRAVENOUS | Status: AC | PRN
  Administered 2018-10-28: 15 mL via INTRAVENOUS

## 2018-10-29 ENCOUNTER — Telehealth: Payer: Self-pay | Admitting: Neurology

## 2018-10-29 NOTE — Telephone Encounter (Signed)
Please call patient, MRI brain showed chronic abnormalities, I would like to review MRI findings with her, give her a follow up appt in 1-2 weeks.  MRI brain (with and without) demonstrating: - Multiple round and ovoid, periventricular, subcortical and peri-callosal T2 hyperintensities. Some of these are hypointense on T1 views. These findings are non-specific and considerations include demyelinating, autoimmune, inflammatory, post-infectious, microvascular ischemic or migraine associated etiologies.  - No abnormal lesions on post-contrast views

## 2018-10-29 NOTE — Telephone Encounter (Signed)
She has been notified of the results below.  She has scheduled a follow up on 11/05/2018 for further review.

## 2018-11-05 ENCOUNTER — Other Ambulatory Visit: Payer: Self-pay

## 2018-11-05 ENCOUNTER — Ambulatory Visit (INDEPENDENT_AMBULATORY_CARE_PROVIDER_SITE_OTHER): Payer: 59 | Admitting: Neurology

## 2018-11-05 ENCOUNTER — Encounter: Payer: Self-pay | Admitting: Neurology

## 2018-11-05 VITALS — BP 116/74 | HR 78 | Temp 98.0°F | Ht 65.0 in | Wt 157.5 lb

## 2018-11-05 DIAGNOSIS — R202 Paresthesia of skin: Secondary | ICD-10-CM

## 2018-11-05 DIAGNOSIS — IMO0002 Reserved for concepts with insufficient information to code with codable children: Secondary | ICD-10-CM | POA: Insufficient documentation

## 2018-11-05 DIAGNOSIS — E559 Vitamin D deficiency, unspecified: Secondary | ICD-10-CM | POA: Diagnosis not present

## 2018-11-05 DIAGNOSIS — R9089 Other abnormal findings on diagnostic imaging of central nervous system: Secondary | ICD-10-CM | POA: Diagnosis not present

## 2018-11-05 DIAGNOSIS — G43709 Chronic migraine without aura, not intractable, without status migrainosus: Secondary | ICD-10-CM

## 2018-11-05 MED ORDER — SUMATRIPTAN SUCCINATE 50 MG PO TABS: 50.0000 mg | ORAL_TABLET | ORAL | 6 refills | Status: DC | PRN

## 2018-11-05 NOTE — Progress Notes (Signed)
PATIENT: Jacqueline Rice DOB: 1966-01-14  Chief Complaint  Patient presents with  . Numbness    She is here to review her MRI results.     HISTORICAL  Jacqueline Rice is a 53 year old female, seen in request by her primary care physician Dr. Briscoe Deutscher ophthalmologist Dr. Katy Fitch, [name], for evaluation of right facial discomfort, initial evaluation was through virtual visit on October 15, 2018.  I have reviewed and summarized the referring note from the referring physician.  She had a history of chickenpox, but never had a history of shingles, over the past few years, she had intermittent right forehead above right eyelid area discomfort, usually short lasting for few weeks it would go away, never had rash broke out  But this time she had similar sensation since March 2020, been ongoing for 3 months, it is painful above right eyelid, extending to right forehead, sensitive to touch, sometimes sharp radiating pain last 5 to 10 minutes, spare right occipital region, the skin is sensitive to touch,  shampooing her hair would trigger the pain, she complains of dry eyes, was seen by Dr. Carolynn Sayers, will reported normal,  Laboratory evaluation June 2020 showed normal TSH, C-reactive protein, A1c of 6.0, vitamin D of 22, lipid profile showed cholesterol of 203, LDL of 118, normal CMP, CBC  UPDATE July 9th 2020: She did have mild rash at right eye brown in June, now has much improved,  Still has mild dry itching skin, not as sensitive,  She also reported a history of migraine headaches, usually clustered around her menstruation peroid of time, right lateralized moderate pounding headache with associated light, noise sensitivity.  She has tried over-the-counter medication with limited help, never tried triptan treatment in the past  Laboratory evaluations in 2020 reviewed, normal TSH, C-reactive protein, CMP, CBC, A1c, vitamin D level was 22  I personally reviewed MRI of the brain with without  contrast on October 28, 2018: Multiple round and ovoid periventricular, subcortical, and pericallosal T2 hyperintensity lesion, some of this are hypointense on T1 views, this raised the possibility of multiple sclerosis  Patient denies a history of strokelike symptoms of sudden visual loss  REVIEW OF SYSTEMS: Full 14 system review of systems performed and notable only for as above All other review of systems were negative.  ALLERGIES: Allergies  Allergen Reactions  . Amoxicillin Nausea And Vomiting  . Penicillins Nausea And Vomiting    HOME MEDICATIONS: Current Outpatient Medications  Medication Sig Dispense Refill  . Ascorbic Acid (VITAMIN C PO) Take by mouth.    . cetirizine (ZYRTEC) 10 MG tablet Take 10 mg by mouth daily.    . Cyanocobalamin (VITAMIN B 12 PO) Take by mouth.    . fluticasone (FLONASE) 50 MCG/ACT nasal spray Place into both nostrils daily.    Marland Kitchen gabapentin (NEURONTIN) 100 MG capsule Take 3 capsules (300 mg total) by mouth 3 (three) times daily. 270 capsule 6  . VITAMIN D, ERGOCALCIFEROL, PO Take by mouth.    . SUMAtriptan (IMITREX) 50 MG tablet Take 1 tablet (50 mg total) by mouth every 2 (two) hours as needed for migraine. May repeat in 2 hours if headache persists or recurs. 10 tablet 6   No current facility-administered medications for this visit.     PAST MEDICAL HISTORY: Past Medical History:  Diagnosis Date  . Allergy   . Gestational diabetes 2005  . Pure hypercholesterolemia 09/30/2017  . Vitamin D deficiency 10/07/2016    PAST SURGICAL HISTORY:  Past Surgical History:  Procedure Laterality Date  . CHOLECYSTECTOMY    . OVARIAN CYST REMOVAL  20 years ago    FAMILY HISTORY: Family History  Problem Relation Age of Onset  . Diabetes Mother   . Mental illness Mother   . High blood pressure Father   . High Cholesterol Father   . Mental illness Brother   . Breast cancer Paternal Aunt   . Colon cancer Neg Hx   . Esophageal cancer Neg Hx   . Rectal cancer  Neg Hx   . Stomach cancer Neg Hx     SOCIAL HISTORY: Social History   Socioeconomic History  . Marital status: Married    Spouse name: Not on file  . Number of children: Not on file  . Years of education: Not on file  . Highest education level: Not on file  Occupational History  . Occupation: Armed forces training and education officer: [employer]  Social Needs  . Financial resource strain: Not on file  . Food insecurity    Worry: Not on file    Inability: Not on file  . Transportation needs    Medical: Not on file    Non-medical: Not on file  Tobacco Use  . Smoking status: Never Smoker  . Smokeless tobacco: Never Used  Substance and Sexual Activity  . Alcohol use: No  . Drug use: No  . Sexual activity: Yes  Lifestyle  . Physical activity    Days per week: Not on file    Minutes per session: Not on file  . Stress: Not on file  Relationships  . Social Herbalist on phone: Not on file    Gets together: Not on file    Attends religious service: Not on file    Active member of club or organization: Not on file    Attends meetings of clubs or organizations: Not on file    Relationship status: Not on file  . Intimate partner violence    Fear of current or ex partner: Not on file    Emotionally abused: Not on file    Physically abused: Not on file    Forced sexual activity: Not on file  Other Topics Concern  . Not on file  Social History Narrative   Right handed    Caffeine 1-2 cups per week     PHYSICAL EXAM   Vitals:   11/05/18 0848  BP: 116/74  Pulse: 78  Temp: 98 F (36.7 C)  Weight: 157 lb 8 oz (71.4 kg)  Height: 5\' 5"  (1.651 m)    Not recorded      Body mass index is 26.21 kg/m.  PHYSICAL EXAMNIATION:  Gen: NAD, conversant, well nourised, obese, well groomed                     Cardiovascular: Regular rate rhythm, no peripheral edema, warm, nontender. Eyes: Conjunctivae clear without exudates or hemorrhage Neck: Supple, no carotid bruits.  Pulmonary: Clear to auscultation bilaterally   NEUROLOGICAL EXAM:  MENTAL STATUS: Speech:    Speech is normal; fluent and spontaneous with normal comprehension.  Cognition:     Orientation to time, place and person     Normal recent and remote memory     Normal Attention span and concentration     Normal Language, naming, repeating,spontaneous speech     Fund of knowledge   CRANIAL NERVES: CN II: Visual fields are full to confrontation.   Pupils  are round equal and briskly reactive to light. CN III, IV, VI: extraocular movement are normal. No ptosis. CN V: Facial sensation is intact to pinprick in all 3 divisions bilaterally. Corneal responses are intact.  There is mild dry scaly skin at right eyebrow CN VII: Face is symmetric with normal eye closure and smile. CN VIII: Hearing is normal to rubbing fingers CN IX, X: Palate elevates symmetrically. Phonation is normal. CN XI: Head turning and shoulder shrug are intact CN XII: Tongue is midline with normal movements and no atrophy.  MOTOR: There is no pronator drift of out-stretched arms. Muscle bulk and tone are normal. Muscle strength is normal.  REFLEXES: Reflexes are 2+ and symmetric at the biceps, triceps, knees, and ankles. Plantar responses are flexor.  SENSORY: Intact to light touch, pinprick, positional sensation and vibratory sensation are intact in fingers and toes.  COORDINATION: Rapid alternating movements and fine finger movements are intact. There is no dysmetria on finger-to-nose and heel-knee-shin.    GAIT/STANCE: Posture is normal. Gait is steady with normal steps, base, arm swing, and turning. Heel and toe walking are normal. Tandem gait is normal.  Romberg is absent.   DIAGNOSTIC DATA (LABS, IMAGING, TESTING) - I reviewed patient records, labs, notes, testing and imaging myself where available.   ASSESSMENT AND PLAN  Jacqueline Rice is a 53 y.o. female   Right forehead area paresthesia, rash  Most  suggestive of right V1 shingles, has much improved, continue gabapentin 100 mg as needed, may consider shingles vaccination in 3 to 6 months  Abnormal MRI of the brain  Raised the possibility of multiple sclerosis  Complete evaluation with MRI of cervical spine with without contrast  Lumbar puncture  Laboratory evaluations  Chronic migraine headaches  Imitrex 50 mg as needed  Vitamin D deficiency  Continue vitamin D supplement   Marcial Pacas, M.D. Ph.D.  [facility] [address], [place], [place] [address] Ph: [phone] Fax: [phone]  CC: Referring Provider

## 2018-11-09 ENCOUNTER — Telehealth: Payer: Self-pay | Admitting: Neurology

## 2018-11-09 NOTE — Telephone Encounter (Signed)
no to the covid-19 questions MR Cervical spine w/wo contrast Dr. Krista Blue [facility] Auth: [id number] (exp. 11/09/18 to 01/08/19). Patient is scheduled at [facility] for 11/11/18.

## 2018-11-11 ENCOUNTER — Other Ambulatory Visit: Payer: Self-pay

## 2018-11-11 ENCOUNTER — Ambulatory Visit (INDEPENDENT_AMBULATORY_CARE_PROVIDER_SITE_OTHER): Payer: 59

## 2018-11-11 DIAGNOSIS — R202 Paresthesia of skin: Secondary | ICD-10-CM

## 2018-11-11 DIAGNOSIS — G43709 Chronic migraine without aura, not intractable, without status migrainosus: Secondary | ICD-10-CM

## 2018-11-11 DIAGNOSIS — R9089 Other abnormal findings on diagnostic imaging of central nervous system: Secondary | ICD-10-CM

## 2018-11-11 DIAGNOSIS — IMO0002 Reserved for concepts with insufficient information to code with codable children: Secondary | ICD-10-CM

## 2018-11-11 DIAGNOSIS — E559 Vitamin D deficiency, unspecified: Secondary | ICD-10-CM

## 2018-11-11 MED ORDER — GADOBENATE DIMEGLUMINE 529 MG/ML IV SOLN
15.0000 mL | Freq: Once | INTRAVENOUS | Status: AC | PRN
  Administered 2018-11-11: 16:00:00 15 mL via INTRAVENOUS

## 2018-11-12 ENCOUNTER — Telehealth: Payer: Self-pay | Admitting: Neurology

## 2018-11-12 NOTE — Telephone Encounter (Signed)
Please call patient, MRI of cervical spine showed degenerative changes, there is no evidence of intrinsic cord lesion. 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

## 2018-11-12 NOTE — Telephone Encounter (Signed)
Left patient a detailed message, with results, on voicemail (ok per DPR).  Provided our number to call back with any questions.  

## 2018-11-13 LAB — LYME, WESTERN BLOT, SERUM (REFLEXED)
IgG P18 Ab.: ABSENT
IgG P23 Ab.: ABSENT
IgG P28 Ab.: ABSENT
IgG P30 Ab.: ABSENT
IgG P39 Ab.: ABSENT
IgG P41 Ab.: ABSENT
IgG P45 Ab.: ABSENT
IgG P58 Ab.: ABSENT
IgG P66 Ab.: ABSENT
IgG P93 Ab.: ABSENT
IgM P23 Ab.: ABSENT
IgM P39 Ab.: ABSENT
IgM P41 Ab.: ABSENT
Lyme IgG Wb: NEGATIVE
Lyme IgM Wb: NEGATIVE

## 2018-11-13 LAB — RPR: RPR Ser Ql: NONREACTIVE

## 2018-11-13 LAB — HEPATITIS PANEL, ACUTE
Hep A IgM: NEGATIVE
Hep B C IgM: NEGATIVE
Hep C Virus Ab: <0.1 s/co ratio (ref 0.0–0.9)
Hepatitis B Surface Ag: NEGATIVE

## 2018-11-13 LAB — HIV ANTIBODY (ROUTINE TESTING W REFLEX): HIV Screen 4th Generation wRfx: NONREACTIVE

## 2018-11-13 LAB — PROTEIN ELECTROPHORESIS
A/G Ratio: 1.3 (ref 0.7–1.7)
Albumin ELP: 4 g/dL (ref 2.9–4.4)
Alpha 1: 0.2 g/dL (ref 0.0–0.4)
Alpha 2: 0.6 g/dL (ref 0.4–1.0)
Beta: 1.1 g/dL (ref 0.7–1.3)
Gamma Globulin: 1.2 g/dL (ref 0.4–1.8)
Globulin, Total: 3.1 g/dL (ref 2.2–3.9)
Total Protein: 7.1 g/dL (ref 6.0–8.5)

## 2018-11-13 LAB — FOLATE: Folate: 19.8 ng/mL (ref >3.0)

## 2018-11-13 LAB — FERRITIN: Ferritin: 28 ng/mL (ref 15–150)

## 2018-11-13 LAB — ANA W/REFLEX IF POSITIVE: Anti Nuclear Antibody (ANA): NEGATIVE

## 2018-11-13 LAB — C-REACTIVE PROTEIN: CRP: 3 mg/L (ref 0–10)

## 2018-11-13 LAB — VITAMIN B12: Vitamin B-12: 1046 pg/mL (ref 232–1245)

## 2018-11-13 LAB — COPPER, SERUM: Copper: 99 ug/dL (ref 72–166)

## 2018-11-13 LAB — B. BURGDORFI ANTIBODIES: Lyme IgG/IgM Ab: 1.06 {ISR} — ABNORMAL HIGH (ref 0.00–0.90)

## 2018-11-13 LAB — SEDIMENTATION RATE: Sed Rate: 8 mm/hr (ref 0–40)

## 2018-11-17 ENCOUNTER — Telehealth: Payer: Self-pay | Admitting: Neurology

## 2018-11-17 NOTE — Telephone Encounter (Signed)
Pt would like to know if she still has to get her spinal tap now that her MRI has been done. Please advise.

## 2018-11-17 NOTE — Telephone Encounter (Signed)
I called the patient back and left a detailed message on her voicemail (ok per DPR) with Dr. Rhea Belton instructions below.  I also reminded her of the appt time for LP.  Provided our number to call back with any further questions.

## 2018-11-17 NOTE — Telephone Encounter (Signed)
Please let patient know, that she still need lumbar puncture, which will provide extra information for the possible diagnosis multiple sclerosis, there is specific findings in the CSF fluid on the majority of patient with multiple sclerosis, she is on schedule for November 19, 2018,

## 2018-11-19 ENCOUNTER — Other Ambulatory Visit: Payer: Self-pay

## 2018-11-19 ENCOUNTER — Ambulatory Visit
Admission: RE | Admit: 2018-11-19 | Discharge: 2018-11-19 | Disposition: A | Discharge status: Home / Self Care | Payer: 59 | Source: Ambulatory Visit | Attending: Neurology | Admitting: Neurology

## 2018-11-19 DIAGNOSIS — R202 Paresthesia of skin: Secondary | ICD-10-CM

## 2018-11-19 DIAGNOSIS — E559 Vitamin D deficiency, unspecified: Secondary | ICD-10-CM

## 2018-11-19 DIAGNOSIS — G43709 Chronic migraine without aura, not intractable, without status migrainosus: Secondary | ICD-10-CM

## 2018-11-19 DIAGNOSIS — IMO0002 Reserved for concepts with insufficient information to code with codable children: Secondary | ICD-10-CM

## 2018-11-19 DIAGNOSIS — R9089 Other abnormal findings on diagnostic imaging of central nervous system: Secondary | ICD-10-CM

## 2018-11-19 NOTE — Discharge Instructions (Signed)

## 2018-11-23 ENCOUNTER — Other Ambulatory Visit: Payer: Self-pay | Admitting: Neurology

## 2018-11-23 MED ORDER — BUTALBITAL-APAP-CAFFEINE 50-325-40 MG PO TABS: 1.0000 | ORAL_TABLET | Freq: Four times a day (QID) | ORAL | 0 refills | Status: DC | PRN

## 2018-11-23 NOTE — Addendum Note (Signed)
Addended by: Noberto Retort C on: 11/23/2018 05:00 PM   Modules accepted: Orders

## 2018-11-23 NOTE — Telephone Encounter (Signed)
From: Vilinda Blanks Casali  Sent: 11/23/2018 12:48 PM EDT  To: [facility]  Subject: Visit Follow-Up Question               Dr. Krista Blue,  I had my lumbar puncture 11/19/18, was fine on 7/24, but for last 2 days have had migraine only when sitting or standing. I called [place] [facility] [place] but they requested I call you. [facility] number is out of order today so far (11/23/18).  Is there something I can do to help migraine?  Thanks, Jacqueline Rice   Please call patient, well hydration, increase caffeine intake, Fioricet as needed

## 2018-11-23 NOTE — Telephone Encounter (Signed)
Her headache is worse with sitting and standing.  The pain is better when lying down.  It started one day post-LP.  The patient is agreeable to rest, hydration, caffeine and Fioricet.  She will call back if her headache does not resolve.

## 2018-11-24 ENCOUNTER — Encounter: Payer: Self-pay | Admitting: Neurology

## 2018-11-24 ENCOUNTER — Ambulatory Visit (INDEPENDENT_AMBULATORY_CARE_PROVIDER_SITE_OTHER): Payer: 59 | Admitting: Neurology

## 2018-11-24 ENCOUNTER — Other Ambulatory Visit: Payer: Self-pay

## 2018-11-24 ENCOUNTER — Telehealth: Payer: Self-pay | Admitting: Neurology

## 2018-11-24 VITALS — BP 138/87 | HR 94 | Temp 98.6°F | Ht 65.0 in | Wt 156.0 lb

## 2018-11-24 DIAGNOSIS — IMO0002 Reserved for concepts with insufficient information to code with codable children: Secondary | ICD-10-CM

## 2018-11-24 DIAGNOSIS — G35 Multiple sclerosis: Secondary | ICD-10-CM | POA: Diagnosis not present

## 2018-11-24 DIAGNOSIS — G43709 Chronic migraine without aura, not intractable, without status migrainosus: Secondary | ICD-10-CM | POA: Diagnosis not present

## 2018-11-24 DIAGNOSIS — G971 Other reaction to spinal and lumbar puncture: Secondary | ICD-10-CM

## 2018-11-24 NOTE — Telephone Encounter (Signed)
I have called patient, csf support the diagnosis of MS, evidence of oligoclonal banding,  She is familiar with [facility] website, her sister-in-law suffered similar disease,  I have recommended potential oral medication as long-term immunomodulation therapy, Aubagio, Gilenya, Tecfidera,  Please give her early appointment to go over options.

## 2018-11-24 NOTE — Telephone Encounter (Signed)
We had a cancellation in our schedule today.  She has been added at 2:30pm.  She will arrive early for check-in.

## 2018-11-24 NOTE — Telephone Encounter (Signed)
Called and spoke to patient's husband Patient will be at GI for Blood patch. Check in at 11:00 am apt 12:00. Wear a mask and husband will have to wait in the car. Husband understood  details and she will be there.

## 2018-11-24 NOTE — Progress Notes (Signed)
PATIENT: Jacqueline Rice DOB: Oct 06, 1965  Chief Complaint  Patient presents with   Multiple Sclerosis    She is here with her husband, Ed,  to review her test results and discuss treatment options.  She is still having a headache, with worsening upon sittting or standing, since having her LP.  Fioricet is only providing little and temporary relief.      HISTORICAL  Jacqueline Rice is a 53 year old female, seen in request by her primary care physician Dr. Briscoe Deutscher ophthalmologist Dr. Katy Fitch, [name], for evaluation of right facial discomfort, initial evaluation was through virtual visit on October 15, 2018.  I have reviewed and summarized the referring note from the referring physician.  She had a history of chickenpox, but never had a history of shingles, over the past few years, she had intermittent right forehead above right eyelid area discomfort, usually short lasting for few weeks it would go away, never had rash broke out  But this time she had similar sensation since March 2020, been ongoing for 3 months, it is painful above right eyelid, extending to right forehead, sensitive to touch, sometimes sharp radiating pain last 5 to 10 minutes, spare right occipital region, the skin is sensitive to touch,  shampooing her hair would trigger the pain, she complains of dry eyes, was seen by Dr. Carolynn Sayers, will reported normal,  Laboratory evaluation June 2020 showed normal TSH, C-reactive protein, A1c of 6.0, vitamin D of 22, lipid profile showed cholesterol of 203, LDL of 118, normal CMP, CBC  UPDATE July 9th 2020: She did have mild rash at right eye brown in June, now has much improved,  Still has mild dry itching skin, not as sensitive,  She also reported a history of migraine headaches, usually clustered around her menstruation peroid of time, right lateralized moderate pounding headache with associated light, noise sensitivity.  She has tried over-the-counter medication with  limited help, never tried triptan treatment in the past  Laboratory evaluations in 2020 reviewed, normal TSH, C-reactive protein, CMP, CBC, A1c, vitamin D level was 22  I personally reviewed MRI of the brain with without contrast on October 28, 2018: Multiple round and ovoid periventricular, subcortical, and pericallosal T2 hyperintensity lesion, some of this are hypointense on T1 views, this raised the possibility of multiple sclerosis   Patient denies a history of strokelike symptoms of sudden visual loss  UPDATE November 24 2018: She had lumbar puncture on November 19, 2018, developed positive lumbar headache, still present, sitting up position for 20 minutes, she would get severe occipital, vertex area headaches, Fioricet provide limited help  CSF showed more than 5 oligoclonal banding, total protein was 72  Personally reviewed MRI of cervical spine C5-6, disc bulging with severe left foraminal stenosis no cord signal changes no evidence of central canal stenosis.    REVIEW OF SYSTEMS: Full 14 system review of systems performed and notable only for as above All other review of systems were negative.  ALLERGIES: Allergies  Allergen Reactions   Amoxicillin Nausea And Vomiting   Penicillins Nausea And Vomiting    HOME MEDICATIONS: Current Outpatient Medications  Medication Sig Dispense Refill   Ascorbic Acid (VITAMIN C PO) Take by mouth.     butalbital-acetaminophen-caffeine (FIORICET) 50-325-40 MG tablet Take 1 tablet by mouth every 6 (six) hours as needed for headache. 12 tablet 0   cetirizine (ZYRTEC) 10 MG tablet Take 10 mg by mouth daily.     Cyanocobalamin (VITAMIN B 12 PO)  Take by mouth.     fluticasone (FLONASE) 50 MCG/ACT nasal spray Place into both nostrils daily.     gabapentin (NEURONTIN) 100 MG capsule Take 3 capsules (300 mg total) by mouth 3 (three) times daily. 270 capsule 6   SUMAtriptan (IMITREX) 50 MG tablet Take 1 tablet (50 mg total) by mouth every 2 (two) hours  as needed for migraine. May repeat in 2 hours if headache persists or recurs. 10 tablet 6   VITAMIN D, ERGOCALCIFEROL, PO Take by mouth.     No current facility-administered medications for this visit.     PAST MEDICAL HISTORY: Past Medical History:  Diagnosis Date   Allergy    Gestational diabetes 2005   Pure hypercholesterolemia 09/30/2017   Vitamin D deficiency 10/07/2016    PAST SURGICAL HISTORY: Past Surgical History:  Procedure Laterality Date   CHOLECYSTECTOMY     OVARIAN CYST REMOVAL  20 years ago    FAMILY HISTORY: Family History  Problem Relation Age of Onset   Diabetes Mother    Mental illness Mother    High blood pressure Father    High Cholesterol Father    Mental illness Brother    Breast cancer Paternal Aunt    Colon cancer Neg Hx    Esophageal cancer Neg Hx    Rectal cancer Neg Hx    Stomach cancer Neg Hx     SOCIAL HISTORY: Social History   Socioeconomic History   Marital status: Married    Spouse name: Not on file   Number of children: Not on file   Years of education: Not on file   Highest education level: Not on file  Occupational History   Occupation: Armed forces training and education officer: [employer]  Social Needs   Financial resource strain: Not on file   Food insecurity    Worry: Not on file    Inability: Not on file   Transportation needs    Medical: Not on file    Non-medical: Not on file  Tobacco Use   Smoking status: Never Smoker   Smokeless tobacco: Never Used  Substance and Sexual Activity   Alcohol use: No   Drug use: No   Sexual activity: Yes  Lifestyle   Physical activity    Days per week: Not on file    Minutes per session: Not on file   Stress: Not on file  Relationships   Social connections    Talks on phone: Not on file    Gets together: Not on file    Attends religious service: Not on file    Active member of club or organization: Not on file    Attends meetings of clubs or  organizations: Not on file    Relationship status: Not on file   Intimate partner violence    Fear of current or ex partner: Not on file    Emotionally abused: Not on file    Physically abused: Not on file    Forced sexual activity: Not on file  Other Topics Concern   Not on file  Social History Narrative   Right handed    Caffeine 1-2 cups per week     PHYSICAL EXAM   Vitals:   11/24/18 1425  BP: 138/87  Pulse: 94  Temp: 98.6 F (37 C)  Weight: 156 lb (70.8 kg)  Height: 5\' 5"  (1.651 m)    Not recorded      Body mass index is 25.96 kg/m.  PHYSICAL EXAMNIATION:  Gen: NAD, conversant, well nourised, obese, well groomed                     Cardiovascular: Regular rate rhythm, no peripheral edema, warm, nontender. Eyes: Conjunctivae clear without exudates or hemorrhage Neck: Supple, no carotid bruits. Pulmonary: Clear to auscultation bilaterally   NEUROLOGICAL EXAM:  MENTAL STATUS: Speech:    Speech is normal; fluent and spontaneous with normal comprehension.  Cognition:     Orientation to time, place and person     Normal recent and remote memory     Normal Attention span and concentration     Normal Language, naming, repeating,spontaneous speech     Fund of knowledge   CRANIAL NERVES: CN II: Visual fields are full to confrontation.   Pupils are round equal and briskly reactive to light. CN III, IV, VI: extraocular movement are normal. No ptosis. CN V: Facial sensation is intact to pinprick in all 3 divisions bilaterally. Corneal responses are intact.  There is mild dry scaly skin at right eyebrow CN VII: Face is symmetric with normal eye closure and smile. CN VIII: Hearing is normal to rubbing fingers CN IX, X: Palate elevates symmetrically. Phonation is normal. CN XI: Head turning and shoulder shrug are intact CN XII: Tongue is midline with normal movements and no atrophy.  MOTOR: There is no pronator drift of out-stretched arms. Muscle bulk and tone  are normal. Muscle strength is normal.  REFLEXES: Reflexes are 2+ and symmetric at the biceps, triceps,3/3 knees, and ankles. Plantar responses are flexor.  SENSORY: Intact to light touch, pinprick, positional sensation and vibratory sensation are intact in fingers and toes.  COORDINATION: Rapid alternating movements and fine finger movements are intact. There is no dysmetria on finger-to-nose and heel-knee-shin.    GAIT/STANCE: Posture is normal. Gait is steady with normal steps, base, arm swing, and turning. Heel and toe walking are normal. Tandem gait is normal.  Romberg is absent.   DIAGNOSTIC DATA (LABS, IMAGING, TESTING) - I reviewed patient records, labs, notes, testing and imaging myself where available.   ASSESSMENT AND PLAN  Brooks Stotz Seith is a 53 y.o. female   Right forehead area paresthesia, rash  Most suggestive of right V1 shingles, has much improved, no longer need gabapentin  Relapsing Remitting Multiple Sclerosis  Confirmed by abnormal MRI of the brain,  CSF showed more than 5 oligoclonal banding  Mild lesion noted on MRI of the brain, no cervical cord lesion  Extensive laboratory evaluation failed to demonstrate etiology  Extensive discussion with patient and her husband, we decided to hold off immunomodulation therapy at this special time was COVID-19,  Repeat MRI of the brain with without contrast in 1 year  Chronic migraine headaches  Imitrex 50 mg as needed Post lumbar puncture headache  Ordered blood patch  Vitamin D deficiency  Continue vitamin D supplement   Marcial Pacas, M.D. Ph.D.  [facility] [address], [place], [place] [address] Ph: [phone] Fax: [phone]  CC: Referring Provider

## 2018-11-25 ENCOUNTER — Ambulatory Visit
Admission: RE | Admit: 2018-11-25 | Discharge: 2018-11-25 | Disposition: A | Discharge status: Home / Self Care | Payer: 59 | Source: Ambulatory Visit | Attending: Neurology | Admitting: Neurology

## 2018-11-25 DIAGNOSIS — IMO0002 Reserved for concepts with insufficient information to code with codable children: Secondary | ICD-10-CM

## 2018-11-25 DIAGNOSIS — G971 Other reaction to spinal and lumbar puncture: Secondary | ICD-10-CM

## 2018-11-25 DIAGNOSIS — G35 Multiple sclerosis: Secondary | ICD-10-CM

## 2018-11-25 DIAGNOSIS — G43709 Chronic migraine without aura, not intractable, without status migrainosus: Secondary | ICD-10-CM

## 2018-11-25 MED ORDER — IOPAMIDOL (ISOVUE-M 200) INJECTION 41%
1.0000 mL | Freq: Once | INTRAMUSCULAR | Status: AC
  Administered 2018-11-25: 1 mL via INTRA_ARTICULAR

## 2018-11-25 NOTE — Progress Notes (Signed)
Blood obtained from pt's L AC for blood patch. Pt tolerated procedure well. Site is unremarkable.

## 2018-11-25 NOTE — Discharge Instructions (Signed)

## 2018-12-18 LAB — MULTIPLE SCLEROSIS PANEL 2
Albumin Serum: 3.7 g/dL (ref 3.5–5.2)
Albumin, CSF: 16.3 mg/dL (ref 8.0–42.0)
CNS-IgG Synthesis Rate: 0.3 mg/24 h (ref <=3.3)
IgG (Immunoglobin G), Serum: 1040 mg/dL (ref 600–1640)
IgG Total CSF: 2.9 mg/dL (ref 0.8–7.7)
IgG-Index: 0.63 (ref <0.66)
Myelin Basic Protein: <2 mcg/L (ref 2.0–4.0)

## 2018-12-18 LAB — GRAM STAIN
GRAM STAIN:: NONE SEEN
MICRO NUMBER:: 696804
SPECIMEN QUALITY:: ADEQUATE

## 2018-12-18 LAB — FUNGUS CULTURE W SMEAR
MICRO NUMBER:: 696805
SMEAR:: NONE SEEN
SPECIMEN QUALITY:: ADEQUATE

## 2018-12-18 LAB — VDRL, CSF: VDRL Quant, CSF: NONREACTIVE

## 2018-12-18 LAB — PROTEIN, CSF: Total Protein, CSF: 46 mg/dL — ABNORMAL HIGH (ref 15–45)

## 2018-12-18 LAB — CSF CELL COUNT WITH DIFFERENTIAL
RBC Count, CSF: 1 cells/uL (ref 0–10)
WBC, CSF: 0 cells/uL (ref 0–5)

## 2018-12-18 LAB — GLUCOSE, CSF: Glucose, CSF: 62 mg/dL (ref 40–80)

## 2019-01-07 ENCOUNTER — Ambulatory Visit: Payer: 59 | Admitting: Neurology

## 2019-02-02 ENCOUNTER — Other Ambulatory Visit: Payer: Self-pay | Admitting: Family Medicine

## 2019-02-02 DIAGNOSIS — Z1231 Encounter for screening mammogram for malignant neoplasm of breast: Secondary | ICD-10-CM

## 2019-02-04 ENCOUNTER — Ambulatory Visit (INDEPENDENT_AMBULATORY_CARE_PROVIDER_SITE_OTHER): Payer: 59

## 2019-02-04 ENCOUNTER — Other Ambulatory Visit: Payer: Self-pay

## 2019-02-04 ENCOUNTER — Encounter: Payer: Self-pay | Admitting: Family Medicine

## 2019-02-04 DIAGNOSIS — Z23 Encounter for immunization: Secondary | ICD-10-CM | POA: Diagnosis not present

## 2019-02-23 ENCOUNTER — Other Ambulatory Visit: Payer: Self-pay

## 2019-02-23 ENCOUNTER — Ambulatory Visit
Admission: RE | Admit: 2019-02-23 | Discharge: 2019-02-23 | Disposition: A | Discharge status: Home / Self Care | Payer: 59 | Source: Ambulatory Visit | Attending: Family Medicine | Admitting: Family Medicine

## 2019-02-23 DIAGNOSIS — Z1231 Encounter for screening mammogram for malignant neoplasm of breast: Secondary | ICD-10-CM

## 2019-05-30 NOTE — Progress Notes (Signed)
PATIENT: Jacqueline Rice DOB: 1965/06/04  REASON FOR VISIT: follow up HISTORY FROM: patient  HISTORY OF PRESENT ILLNESS: Today 05/31/19  HISTORY  Jacqueline Rice is a 54 year old female, seen in request by her primary care physician Dr. Briscoe Deutscher ophthalmologist Dr. Katy Fitch, [name], for evaluation of right facial discomfort, initial evaluation was through virtual visit on October 15, 2018.  I have reviewed and summarized the referring note from the referring physician.  She had a history of chickenpox, but never had a history of shingles, over the past few years, she had intermittent right forehead above right eyelid area discomfort, usually short lasting for few weeks it would go away, never had rash broke out  But this time she had similar sensation since March 2020, been ongoing for 3 months, it is painful above right eyelid, extending to right forehead, sensitive to touch, sometimes sharp radiating pain last 5 to 10 minutes, spare right occipital region, the skin is sensitive to touch,  shampooing her hair would trigger the pain, she complains of dry eyes, was seen by Dr. Carolynn Sayers, will reported normal,  Laboratory evaluation June 2020 showed normal TSH, C-reactive protein, A1c of 6.0, vitamin D of 22, lipid profile showed cholesterol of 203, LDL of 118, normal CMP, CBC  UPDATE July 9th 2020: She did have mild rash at right eye brown in June, now has much improved,  Still has mild dry itching skin, not as sensitive,  She also reported a history of migraine headaches, usually clustered around her menstruation peroid of time, right lateralized moderate pounding headache with associated light, noise sensitivity.  She has tried over-the-counter medication with limited help, never tried triptan treatment in the past  Laboratory evaluations in 2020 reviewed, normal TSH, C-reactive protein, CMP, CBC, A1c, vitamin D level was 22  I personally reviewed MRI of the brain with without  contrast on October 28, 2018: Multiple round and ovoid periventricular, subcortical, and pericallosal T2 hyperintensity lesion, some of this are hypointense on T1 views, this raised the possibility of multiple sclerosis   Patient denies a history of strokelike symptoms of sudden visual loss  UPDATE November 24 2018: She had lumbar puncture on November 19, 2018, developed positive lumbar headache, still present, sitting up position for 20 minutes, she would get severe occipital, vertex area headaches, Fioricet provide limited help  CSF showed more than 5 oligoclonal banding, total protein was 27  Personally reviewed MRI of cervical spine C5-6, disc bulging with severe left foraminal stenosis no cord signal changes no evidence of central canal stenosis.  Update May 31, 2019 SS: She denies any MS symptoms, reports her migraines have been doing well, a few weeks ago she did have a headache that lasted the entire weekend, she took Imitrex left over, and Fioricet.  Fioricet works well for her headaches.  She says 2 weeks ago, developed another flare of presumed shingles to her right eyebrow, lower eye, no lesions, just hypersensitivity, paresthesia.  She has been treating with gabapentin with improvement.  No changes in vision. She previously saw Dr. Katy Fitch in the summer, was told no change in vision.  She says she has been getting the flares about once a year for the last 4 years.  She is working full-time from home, processing disability claims.  REVIEW OF SYSTEMS: Out of a complete 14 system review of symptoms, the patient complains only of the following symptoms, and all other reviewed systems are negative.  Rash  ALLERGIES: Allergies  Allergen Reactions  . Amoxicillin Nausea And Vomiting  . Penicillins Nausea And Vomiting    HOME MEDICATIONS: Outpatient Medications Prior to Visit  Medication Sig Dispense Refill  . Ascorbic Acid (VITAMIN C PO) Take by mouth.    . cetirizine (ZYRTEC) 10 MG tablet  Take 10 mg by mouth daily.    . fluticasone (FLONASE) 50 MCG/ACT nasal spray Place into both nostrils daily.    Marland Kitchen gabapentin (NEURONTIN) 100 MG capsule Take 3 capsules (300 mg total) by mouth 3 (three) times daily. 270 capsule 6  . Multiple Vitamin (MULTIVITAMIN ADULT PO) Take by mouth. OTC    . VITAMIN D, ERGOCALCIFEROL, PO Take by mouth.    . butalbital-acetaminophen-caffeine (FIORICET) 50-325-40 MG tablet Take 1 tablet by mouth every 6 (six) hours as needed for headache. (Patient not taking: Reported on 05/31/2019) 12 tablet 0  . Cyanocobalamin (VITAMIN B 12 PO) Take by mouth.    . SUMAtriptan (IMITREX) 50 MG tablet Take 1 tablet (50 mg total) by mouth every 2 (two) hours as needed for migraine. May repeat in 2 hours if headache persists or recurs. 10 tablet 6   No facility-administered medications prior to visit.    PAST MEDICAL HISTORY: Past Medical History:  Diagnosis Date  . Allergy   . Gestational diabetes 2005  . Pure hypercholesterolemia 09/30/2017  . Vitamin D deficiency 10/07/2016    PAST SURGICAL HISTORY: Past Surgical History:  Procedure Laterality Date  . CHOLECYSTECTOMY    . OVARIAN CYST REMOVAL  20 years ago    FAMILY HISTORY: Family History  Problem Relation Age of Onset  . Diabetes Mother   . Mental illness Mother   . High blood pressure Father   . High Cholesterol Father   . Mental illness Brother   . Breast cancer Paternal Aunt   . Colon cancer Neg Hx   . Esophageal cancer Neg Hx   . Rectal cancer Neg Hx   . Stomach cancer Neg Hx     SOCIAL HISTORY: Social History   Socioeconomic History  . Marital status: Married    Spouse name: Not on file  . Number of children: Not on file  . Years of education: Not on file  . Highest education level: Not on file  Occupational History  . Occupation: Armed forces training and education officer: [employer]  Tobacco Use  . Smoking status: Never Smoker  . Smokeless tobacco: Never Used  Substance and Sexual Activity  . Alcohol  use: No  . Drug use: No  . Sexual activity: Yes  Other Topics Concern  . Not on file  Social History Narrative   Right handed    Caffeine 1-2 cups per week   Social Determinants of Health   Financial Resource Strain:   . Difficulty of Paying Living Expenses: Not on file  Food Insecurity:   . Worried About Charity fundraiser in the Last Year: Not on file  . Ran Out of Food in the Last Year: Not on file  Transportation Needs:   . Lack of Transportation (Medical): Not on file  . Lack of Transportation (Non-Medical): Not on file  Physical Activity:   . Days of Exercise per Week: Not on file  . Minutes of Exercise per Session: Not on file  Stress:   . Feeling of Stress : Not on file  Social Connections:   . Frequency of Communication with Friends and Family: Not on file  . Frequency of Social Gatherings with Friends and  Family: Not on file  . Attends Religious Services: Not on file  . Active Member of Clubs or Organizations: Not on file  . Attends Archivist Meetings: Not on file  . Marital Status: Not on file  Intimate Partner Violence:   . Fear of Current or Ex-Partner: Not on file  . Emotionally Abused: Not on file  . Physically Abused: Not on file  . Sexually Abused: Not on file  PHYSICAL EXAM  Vitals:   05/31/19 1045  BP: 122/80  Pulse: 80  Temp: (!) 97.2 F (36.2 C)  Weight: 152 lb 9.6 oz (69.2 kg)  Height: 5\' 5"  (1.651 m)   Body mass index is 25.39 kg/m.  Generalized: Well developed, in no acute distress   Neurological examination  Mentation: Alert oriented to time, place, history taking. Follows all commands speech and language fluent Cranial nerve II-XII: Pupils were equal round reactive to light. Extraocular movements were full, visual field were full on confrontational test. Mild area of redness to right lower eyelid, puffy, single point circular red area to medial right eyebrow. Facial sensation and strength were normal.  Head turning and  shoulder shrug  were normal and symmetric. Motor: The motor testing reveals 5 over 5 strength of all 4 extremities. Good symmetric motor tone is noted throughout.  Sensory: Sensory testing is intact to soft touch on all 4 extremities. No evidence of extinction is noted.  Coordination: Cerebellar testing reveals good finger-nose-finger and heel-to-shin bilaterally.  Gait and station: Gait is normal. Tandem gait is normal. Romberg is negative. No drift is seen.  Reflexes: Deep tendon reflexes are symmetric and normal bilaterally.   DIAGNOSTIC DATA (LABS, IMAGING, TESTING) - I reviewed patient records, labs, notes, testing and imaging myself where available.  Lab Results  Component Value Date   WBC 6.5 10/02/2018   HGB 13.1 10/02/2018   HCT 39.0 10/02/2018   MCV 82.5 10/02/2018   PLT 215.0 10/02/2018      Component Value Date/Time   NA 137 10/02/2018 0851   K 4.5 10/02/2018 0851   CL 103 10/02/2018 0851   CO2 27 10/02/2018 0851   GLUCOSE 99 10/02/2018 0851   BUN 15 10/02/2018 0851   CREATININE 0.80 10/02/2018 0851   CALCIUM 8.8 10/02/2018 0851   PROT 7.1 11/05/2018 0927   ALBUMIN 3.7 11/19/2018 0814   AST 10 10/02/2018 0851   ALT 11 10/02/2018 0851   ALKPHOS 45 10/02/2018 0851   BILITOT 0.5 10/02/2018 0851   GFRNONAA 116.64 12/02/2008 1507   Lab Results  Component Value Date   CHOL 203 (H) 10/02/2018   HDL 61.90 10/02/2018   LDLCALC 118 (H) 10/02/2018   TRIG 115.0 10/02/2018   CHOLHDL 3 10/02/2018   Lab Results  Component Value Date   HGBA1C 6.0 10/02/2018   Lab Results  Component Value Date   VITAMINB12 1,046 11/05/2018   Lab Results  Component Value Date   TSH 1.10 10/02/2018    ASSESSMENT AND PLAN 54 y.o. year old female  has a past medical history of Allergy, Gestational diabetes (2005), Pure hypercholesterolemia (09/30/2017), and Vitamin D deficiency (10/07/2016). here with:  1.  Right forehead area paresthesia, rash -Most suggestive of right V1 shingles,  acute outbreak 2 weeks ago, discussed with Dr. Krista Blue, will treat with Valtrex 1000 mg tablet 3 times a day x7 days, prednisone 20 mg tablet x3 days, 10 mg x 3 days, continue gabapentin 300 mg 3 times a day as needed -If no improvement after  treatment will follow-up with ophthalmology, otherwise seeing them in the summer  2.  Relapsing remitting multiple sclerosis -Confirmed by abnormal MRI of the brain -CSF showed more than 5 oligoclonal banding -Mild lesion noted on MRI of the brain, no cervical cord lesion -Extensive laboratory evaluation failed to demonstrate etiology -After extensive discussion, has decided to hold off on immunomodulating therapy -Repeat MRI of the brain with and without contrast at next appointment  3.  Chronic migraine headache -Fioricet as needed, refill was sent  4.  Vitamin D deficiency -Is on vitamin D supplement  I spent 25 minutes with the patient. 50% of this time was spent discussing her plan of care.  Butler Denmark, AGNP-C, DNP 05/31/2019, 10:48 AM [facility] [address], [place], [place] [address] [phone]

## 2019-05-31 ENCOUNTER — Ambulatory Visit (INDEPENDENT_AMBULATORY_CARE_PROVIDER_SITE_OTHER): Payer: 59 | Admitting: Neurology

## 2019-05-31 ENCOUNTER — Other Ambulatory Visit: Payer: Self-pay

## 2019-05-31 ENCOUNTER — Encounter: Payer: Self-pay | Admitting: Neurology

## 2019-05-31 VITALS — BP 122/80 | HR 80 | Temp 97.2°F | Ht 65.0 in | Wt 152.6 lb

## 2019-05-31 DIAGNOSIS — R202 Paresthesia of skin: Secondary | ICD-10-CM | POA: Diagnosis not present

## 2019-05-31 DIAGNOSIS — IMO0002 Reserved for concepts with insufficient information to code with codable children: Secondary | ICD-10-CM

## 2019-05-31 DIAGNOSIS — G43709 Chronic migraine without aura, not intractable, without status migrainosus: Secondary | ICD-10-CM

## 2019-05-31 DIAGNOSIS — G35 Multiple sclerosis: Secondary | ICD-10-CM | POA: Diagnosis not present

## 2019-05-31 MED ORDER — VALACYCLOVIR HCL 1 G PO TABS: 1000.0000 mg | ORAL_TABLET | Freq: Three times a day (TID) | ORAL | 0 refills | Status: DC

## 2019-05-31 MED ORDER — PREDNISONE 20 MG PO TABS: ORAL_TABLET | ORAL | 0 refills | Status: DC

## 2019-05-31 MED ORDER — BUTALBITAL-APAP-CAFFEINE 50-325-40 MG PO TABS: 1.0000 | ORAL_TABLET | Freq: Four times a day (QID) | ORAL | 0 refills | Status: DC | PRN

## 2019-05-31 NOTE — Patient Instructions (Addendum)
It was great to meet you today!   I will refill the Fioricet to take as needed for acute headache   Take Valtrex 1000 mg tablet three times daily x 7 days, take prednisone 20 mg tablet x 3 days, then take 10 mg tablet x 3 days  We will plan for MRI in 6 months  See you in 6 months

## 2019-06-08 NOTE — Progress Notes (Signed)
I have reviewed and agreed above plan. 

## 2019-06-21 ENCOUNTER — Encounter: Payer: Self-pay | Admitting: Neurology

## 2019-07-04 ENCOUNTER — Ambulatory Visit: Payer: 59 | Attending: Internal Medicine

## 2019-07-04 DIAGNOSIS — Z23 Encounter for immunization: Secondary | ICD-10-CM | POA: Insufficient documentation

## 2019-07-04 NOTE — Progress Notes (Signed)
   Covid-19 Vaccination Clinic  Name:  Jacqueline Rice    MRN: [id number] DOB: 08/31/1965  07/04/2019  [name] was observed post Covid-19 immunization for 15 minutes without incident. She was provided with Vaccine Information Sheet and instruction to access the V-Safe system.   [name] was instructed to call 911 with any severe reactions post vaccine: Marland Kitchen Difficulty breathing  . Swelling of face and throat  . A fast heartbeat  . A bad rash all over body  . Dizziness and weakness   Immunizations Administered    Name Date Dose VIS Date Route   Pfizer COVID-19 Vaccine 07/04/2019  5:57 PM 0.3 mL 04/09/2019 Intramuscular   Manufacturer: [place]   Lot: [id number]   [place]: [id number]

## 2019-07-25 ENCOUNTER — Ambulatory Visit: Payer: 59 | Attending: Internal Medicine

## 2019-07-25 DIAGNOSIS — Z23 Encounter for immunization: Secondary | ICD-10-CM

## 2019-07-25 NOTE — Progress Notes (Signed)
   Covid-19 Vaccination Clinic  Name:  Jacqueline Rice    MRN: [id number] DOB: 07-24-1965  07/25/2019  Jacqueline Rice was observed post Covid-19 immunization for 15 minutes without incident. She was provided with Vaccine Information Sheet and instruction to access the V-Safe system.   Jacqueline Rice was instructed to call 911 with any severe reactions post vaccine: Marland Kitchen Difficulty breathing  . Swelling of face and throat  . A fast heartbeat  . A bad rash all over body  . Dizziness and weakness   Immunizations Administered    Name Date Dose VIS Date Route   Pfizer COVID-19 Vaccine 07/25/2019  3:28 PM 0.3 mL 04/09/2019 Intramuscular   Manufacturer: [place]   Lot: [id number]   [place]: [id number]

## 2019-08-17 ENCOUNTER — Ambulatory Visit (INDEPENDENT_AMBULATORY_CARE_PROVIDER_SITE_OTHER): Payer: 59

## 2019-08-17 ENCOUNTER — Other Ambulatory Visit: Payer: Self-pay

## 2019-08-17 DIAGNOSIS — Z23 Encounter for immunization: Secondary | ICD-10-CM | POA: Diagnosis not present

## 2019-08-17 NOTE — Progress Notes (Signed)
Agree with documentation and careplan.

## 2019-10-04 ENCOUNTER — Other Ambulatory Visit: Payer: Self-pay

## 2019-10-04 ENCOUNTER — Encounter: Payer: 59 | Admitting: Family Medicine

## 2019-10-04 ENCOUNTER — Encounter: Payer: Self-pay | Admitting: Family Medicine

## 2019-10-04 ENCOUNTER — Ambulatory Visit (INDEPENDENT_AMBULATORY_CARE_PROVIDER_SITE_OTHER): Payer: 59 | Admitting: Family Medicine

## 2019-10-04 VITALS — BP 114/58 | HR 89 | Temp 98.0°F | Resp 15 | Ht 65.0 in | Wt 154.8 lb

## 2019-10-04 DIAGNOSIS — G35 Multiple sclerosis: Secondary | ICD-10-CM

## 2019-10-04 DIAGNOSIS — G43709 Chronic migraine without aura, not intractable, without status migrainosus: Secondary | ICD-10-CM | POA: Diagnosis not present

## 2019-10-04 DIAGNOSIS — Z0000 Encounter for general adult medical examination without abnormal findings: Secondary | ICD-10-CM | POA: Diagnosis not present

## 2019-10-04 DIAGNOSIS — IMO0002 Reserved for concepts with insufficient information to code with codable children: Secondary | ICD-10-CM

## 2019-10-04 LAB — LIPID PANEL
Cholesterol: 220 mg/dL — ABNORMAL HIGH (ref 0–200)
HDL: 56.7 mg/dL (ref >39.00)
LDL Cholesterol: 134 mg/dL — ABNORMAL HIGH (ref 0–99)
NonHDL: 163.16
Total CHOL/HDL Ratio: 4
Triglycerides: 147 mg/dL (ref 0.0–149.0)
VLDL: 29.4 mg/dL (ref 0.0–40.0)

## 2019-10-04 LAB — CBC WITH DIFFERENTIAL/PLATELET
Basophils Absolute: 0 10*3/uL (ref 0.0–0.1)
Basophils Relative: 0.6 % (ref 0.0–3.0)
Eosinophils Absolute: 0.2 10*3/uL (ref 0.0–0.7)
Eosinophils Relative: 3.7 % (ref 0.0–5.0)
HCT: 38.7 % (ref 36.0–46.0)
Hemoglobin: 12.8 g/dL (ref 12.0–15.0)
Lymphocytes Relative: 32.9 % (ref 12.0–46.0)
Lymphs Abs: 1.8 10*3/uL (ref 0.7–4.0)
MCHC: 33.1 g/dL (ref 30.0–36.0)
MCV: 84.5 fl (ref 78.0–100.0)
Monocytes Absolute: 0.3 10*3/uL (ref 0.1–1.0)
Monocytes Relative: 6.1 % (ref 3.0–12.0)
Neutro Abs: 3.1 10*3/uL (ref 1.4–7.7)
Neutrophils Relative %: 56.7 % (ref 43.0–77.0)
Platelets: 267 10*3/uL (ref 150.0–400.0)
RBC: 4.58 Mil/uL (ref 3.87–5.11)
RDW: 13.6 % (ref 11.5–15.5)
WBC: 5.5 10*3/uL (ref 4.0–10.5)

## 2019-10-04 LAB — COMPREHENSIVE METABOLIC PANEL
ALT: 16 U/L (ref 0–35)
AST: 15 U/L (ref 0–37)
Albumin: 4.3 g/dL (ref 3.5–5.2)
Alkaline Phosphatase: 42 U/L (ref 39–117)
BUN: 12 mg/dL (ref 6–23)
CO2: 27 mEq/L (ref 19–32)
Calcium: 9.2 mg/dL (ref 8.4–10.5)
Chloride: 103 mEq/L (ref 96–112)
Creatinine, Ser: 0.72 mg/dL (ref 0.40–1.20)
GFR: 84.86 mL/min (ref >60.00)
Glucose, Bld: 93 mg/dL (ref 70–99)
Potassium: 4.3 mEq/L (ref 3.5–5.1)
Sodium: 136 mEq/L (ref 135–145)
Total Bilirubin: 0.5 mg/dL (ref 0.2–1.2)
Total Protein: 6.8 g/dL (ref 6.0–8.3)

## 2019-10-04 LAB — TSH: TSH: 1.24 u[IU]/mL (ref 0.35–4.50)

## 2019-10-04 LAB — HEMOGLOBIN A1C: Hgb A1c MFr Bld: 5.9 % (ref 4.6–6.5)

## 2019-10-04 NOTE — Progress Notes (Signed)
Subjective  Chief Complaint  Patient presents with  . Transitions Of Care    due for shingrix, recently diagnosed with MS - Dr. Marcial Pacas w/ [facility]  . Annual Exam    fasting     HPI: Jacqueline Rice is a 54 y.o. female who presents to [facility] today for a Female Wellness Visit. She also has the concerns and/or needs as listed above in the chief complaint. These will be addressed in addition to the Health Maintenance Visit.   Wellness Visit: annual visit with health maintenance review and exam without Pap   HM: screens are up to date. 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 Working with neuro  H/o migraines  Elevated a1c w/o sxs of hyperglycemia. Had h/o gestational diabetes .  Assessment  1. Annual physical exam   2. Multiple sclerosis ([place])   3. Chronic migraine      Plan  Female Wellness Visit:  Age appropriate Health Maintenance and Prevention measures were discussed with patient. Included topics are cancer screening recommendations, ways to keep healthy (see AVS) including dietary and exercise recommendations, regular eye and dental care, use of seat belts, and avoidance of moderate alcohol use and tobacco use.   BMI: discussed patient's BMI and encouraged positive lifestyle modifications to help get to or maintain a target BMI.  HM needs and immunizations were addressed and ordered. See below for orders. See HM and immunization section for updates. Due 2nd shingrix next month  Routine labs and screening tests ordered including cmp, cbc and lipids where appropriate.  Discussed recommendations regarding Vit D and calcium supplementation (see AVS)  Chronic disease management visit and/or acute  problem visit:  MS: feeling well and coping with new dx well. F/u with neuro upcoming  Migraines: controlled  Recheck lipids and sugars.   Follow up: Return in about 1 year (around 10/03/2020) for complete physical.  Orders Placed This Encounter  Procedures  . CBC with Differential/Platelet  . Comprehensive metabolic panel  . Lipid panel  . TSH  . Hemoglobin A1c   No orders of the defined types were placed in this encounter.     Lifestyle: Body mass index is 25.76 kg/m. Wt Readings from Last 3 Encounters:  10/04/19 154 lb 12.8 oz (70.2 kg)  05/31/19 152 lb 9.6 oz (69.2 kg)  11/24/18 156 lb (70.8 kg)     Patient Active Problem List   Diagnosis Date Noted  . Multiple sclerosis ([place]) 11/24/2018  . Chronic migraine 11/05/2018  . Insulin resistance 10/02/2018  . Vitamin D deficiency 10/07/2016   Health Maintenance  Topic Date Due  . INFLUENZA VACCINE  11/28/2019  . MAMMOGRAM  02/23/2020  . PAP SMEAR-Modifier  10/07/2021  . TETANUS/TDAP  10/01/2027  . COLONOSCOPY  02/24/2028  . COVID-19 Vaccine  Completed  . HIV Screening  Completed   Immunization History  Administered Date(s) Administered  . Influenza Inj Mdck Quad Pf 02/14/2018  . Influenza,inj,Quad PF,6+ Mos 02/02/2017, 02/04/2019  . PFIZER SARS-COV-2 Vaccination 07/04/2019, 07/25/2019  . Tdap 09/30/2017  . Zoster Recombinat (Shingrix) 08/17/2019   We updated and reviewed the patient's past history in detail and it is documented below. Allergies: Patient is allergic to amoxicillin and penicillins. Past Medical  History Patient  has a past medical history of Allergy, Gestational diabetes (2005), and Vitamin D deficiency (10/07/2016). Past Surgical History Patient  has a past surgical history that includes Ovarian cyst removal (20 years ago) and Cholecystectomy. Family History: Patient family history includes Breast cancer in her paternal aunt; Diabetes in her mother; Healthy in her daughter and son; High  Cholesterol in her father; High blood pressure in her father; Mental illness in her brother and mother. Social History:  Patient  reports that she has never smoked. She has never used smokeless tobacco. She reports that she does not drink alcohol or use drugs.  Review of Systems: Constitutional: negative for fever or malaise Ophthalmic: negative for photophobia, double vision or loss of vision Cardiovascular: negative for chest pain, dyspnea on exertion, or new LE swelling Respiratory: negative for SOB or persistent cough Gastrointestinal: negative for abdominal pain, change in bowel habits or melena Genitourinary: negative for dysuria or gross hematuria, no abnormal uterine bleeding or disharge Musculoskeletal: negative for new gait disturbance or muscular weakness Integumentary: negative for new or persistent rashes, no breast lumps Neurological: negative for TIA or stroke symptoms Psychiatric: negative for SI or delusions Allergic/Immunologic: negative for hives  Patient Care Team    Relationship Specialty Notifications Start End  Leamon Arnt, MD PCP - General Family Medicine  10/04/19   Debbra Riding, MD Consulting Physician Ophthalmology  10/12/18   Marcial Pacas, MD Consulting Physician Neurology  10/04/19     Objective  Vitals: BP (!) 114/58   Pulse 89   Temp 98 F (36.7 C) (Temporal)   Resp 15   Ht 5\' 5"  (1.651 m)   Wt 154 lb 12.8 oz (70.2 kg)   SpO2 98%   BMI 25.76 kg/m  General:  Well developed, well nourished, no acute distress  Psych:  Alert and orientedx3,normal mood and affect HEENT:  Normocephalic, atraumatic, non-icteric sclera,  supple neck without adenopathy, mass or thyromegaly Cardiovascular:  Normal S1, S2, RRR without gallop, rub or murmur Respiratory:  Good breath sounds bilaterally, CTAB with normal respiratory effort Gastrointestinal: normal bowel sounds, soft, non-tender, no noted masses. No HSM MSK: no deformities, contusions. Joints are without  erythema or swelling.  Skin:  Warm, no rashes or suspicious lesions noted, multiple freckles and seb k's Neurologic:    Mental status is normal. CN 2-11 are normal. Gross motor and sensory exams are normal. Normal gait. No tremor Breast Exam: No mass, skin retraction or nipple discharge is appreciated in either breast. No axillary adenopathy. Fibrocystic changes are not noted    Commons side effects, risks, benefits, and alternatives for medications and treatment plan prescribed today were discussed, and the patient expressed understanding of the given instructions. Patient is instructed to call or message via MyChart if he/she has any questions or concerns regarding our treatment plan. No barriers to understanding were identified. We discussed Red Flag symptoms and signs in detail. Patient expressed understanding regarding what to do in case of urgent or emergency type symptoms.   Medication list was reconciled, printed and provided to the patient in AVS. Patient instructions and summary information was reviewed with the patient as documented in the AVS. This note was prepared with assistance of Dragon voice recognition software. Occasional wrong-word or sound-a-like substitutions may have occurred due to the inherent limitations of voice recognition software  This visit occurred during the SARS-CoV-2 public health emergency.  Safety protocols were in place, including screening questions prior to the visit, additional usage of  staff PPE, and extensive cleaning of exam room while observing appropriate contact time as indicated for disinfecting solutions.

## 2019-10-04 NOTE — Patient Instructions (Signed)
Please return in 12 months for your annual complete physical; please come fasting. Please schedule a nurse visit after June 20th for your 2nd shingrix vaccination.   I will release your lab results to you on your MyChart account with further instructions. Please reply with any questions.   It was a pleasure meeting you today! Thank you for choosing [place] to meet your healthcare needs! I truly look forward to working with you. If you have any questions or concerns, please send me a message via Mychart or call the office at [phone].   Preventive Care 50-61 Years Old, Female Preventive care refers to visits with your health care provider and lifestyle choices that can promote health and wellness. This includes:  A yearly physical exam. This may also be called an annual well check.  Regular dental visits and eye exams.  Immunizations.  Screening for certain conditions.  Healthy lifestyle choices, such as eating a healthy diet, getting regular exercise, not using drugs or products that contain nicotine and tobacco, and limiting alcohol use. What can I expect for my preventive care visit? Physical exam Your health care provider will check your:  Height and weight. This may be used to calculate body mass index (BMI), which tells if you are at a healthy weight.  Heart rate and blood pressure.  Skin for abnormal spots. Counseling Your health care provider may ask you questions about your:  Alcohol, tobacco, and drug use.  Emotional well-being.  Home and relationship well-being.  Sexual activity.  Eating habits.  Work and work Statistician.  Method of birth control.  Menstrual cycle.  Pregnancy history. What immunizations do I need?  Influenza (flu) vaccine  This is recommended every year. Tetanus, diphtheria, and pertussis (Tdap) vaccine  You may need a Td booster every 10 years. Varicella (chickenpox) vaccine  You may need this if you have not been  vaccinated. Zoster (shingles) vaccine  You may need this after age 82. Measles, mumps, and rubella (MMR) vaccine  You may need at least one dose of MMR if you were born in 1957 or later. You may also need a second dose. Pneumococcal conjugate (PCV13) vaccine  You may need this if you have certain conditions and were not previously vaccinated. Pneumococcal polysaccharide (PPSV23) vaccine  You may need one or two doses if you smoke cigarettes or if you have certain conditions. Meningococcal conjugate (MenACWY) vaccine  You may need this if you have certain conditions. Hepatitis A vaccine  You may need this if you have certain conditions or if you travel or work in places where you may be exposed to hepatitis A. Hepatitis B vaccine  You may need this if you have certain conditions or if you travel or work in places where you may be exposed to hepatitis B. Haemophilus influenzae type b (Hib) vaccine  You may need this if you have certain conditions. Human papillomavirus (HPV) vaccine  If recommended by your health care provider, you may need three doses over 6 months. You may receive vaccines as individual doses or as more than one vaccine together in one shot (combination vaccines). Talk with your health care provider about the risks and benefits of combination vaccines. What tests do I need? Blood tests  Lipid and cholesterol levels. These may be checked every 5 years, or more frequently if you are over 62 years old.  Hepatitis C test.  Hepatitis B test. Screening  Lung cancer screening. You may have this screening every year starting at age  55 if you have a 30-pack-year history of smoking and currently smoke or have quit within the past 15 years.  Colorectal cancer screening. All adults should have this screening starting at age 66 and continuing until age 42. Your health care provider may recommend screening at age 49 if you are at increased risk. You will have tests every  1-10 years, depending on your results and the type of screening test.  Diabetes screening. This is done by checking your blood sugar (glucose) after you have not eaten for a while (fasting). You may have this done every 1-3 years.  Mammogram. This may be done every 1-2 years. Talk with your health care provider about when you should start having regular mammograms. This may depend on whether you have a family history of breast cancer.  BRCA-related cancer screening. This may be done if you have a family history of breast, ovarian, tubal, or peritoneal cancers.  Pelvic exam and Pap test. This may be done every 3 years starting at age 22. Starting at age 36, this may be done every 5 years if you have a Pap test in combination with an HPV test. Other tests  Sexually transmitted disease (STD) testing.  Bone density scan. This is done to screen for osteoporosis. You may have this scan if you are at high risk for osteoporosis. Follow these instructions at home: Eating and drinking  Eat a diet that includes fresh fruits and vegetables, whole grains, lean protein, and low-fat dairy.  Take vitamin and mineral supplements as recommended by your health care provider.  Do not drink alcohol if: ? Your health care provider tells you not to drink. ? You are pregnant, may be pregnant, or are planning to become pregnant.  If you drink alcohol: ? Limit how much you have to 0-1 drink a day. ? Be aware of how much alcohol is in your drink. In the [place] one drink equals one 12 oz bottle of beer (355 mL), one 5 oz glass of wine (148 mL), or one 1 oz glass of hard liquor (44 mL). Lifestyle  Take daily care of your teeth and gums.  Stay active. Exercise for at least 30 minutes on 5 or more days each week.  Do not use any products that contain nicotine or tobacco, such as cigarettes, e-cigarettes, and chewing tobacco. If you need help quitting, ask your health care provider.  If you are sexually active,  practice safe sex. Use a condom or other form of birth control (contraception) in order to prevent pregnancy and STIs (sexually transmitted infections).  If told by your health care provider, take low-dose aspirin daily starting at age 46. What's next?  Visit your health care provider once a year for a well check visit.  Ask your health care provider how often you should have your eyes and teeth checked.  Stay up to date on all vaccines. This information is not intended to replace advice given to you by your health care provider. Make sure you discuss any questions you have with your health care provider. Document Revised: 12/25/2017 Document Reviewed: 12/25/2017 Elsevier Patient Education  2020 [employer].

## 2019-10-27 ENCOUNTER — Ambulatory Visit (INDEPENDENT_AMBULATORY_CARE_PROVIDER_SITE_OTHER): Payer: 59

## 2019-10-27 ENCOUNTER — Other Ambulatory Visit: Payer: Self-pay

## 2019-10-27 ENCOUNTER — Ambulatory Visit: Payer: 59

## 2019-10-27 DIAGNOSIS — Z23 Encounter for immunization: Secondary | ICD-10-CM

## 2019-11-30 ENCOUNTER — Ambulatory Visit (INDEPENDENT_AMBULATORY_CARE_PROVIDER_SITE_OTHER): Payer: 59 | Admitting: Neurology

## 2019-11-30 ENCOUNTER — Encounter: Payer: Self-pay | Admitting: Neurology

## 2019-11-30 ENCOUNTER — Other Ambulatory Visit: Payer: Self-pay

## 2019-11-30 VITALS — BP 118/72 | HR 78 | Wt 159.0 lb

## 2019-11-30 DIAGNOSIS — G35 Multiple sclerosis: Secondary | ICD-10-CM | POA: Diagnosis not present

## 2019-11-30 DIAGNOSIS — G43709 Chronic migraine without aura, not intractable, without status migrainosus: Secondary | ICD-10-CM

## 2019-11-30 DIAGNOSIS — IMO0002 Reserved for concepts with insufficient information to code with codable children: Secondary | ICD-10-CM

## 2019-11-30 MED ORDER — BUTALBITAL-APAP-CAFFEINE 50-325-40 MG PO TABS: 1.0000 | ORAL_TABLET | Freq: Four times a day (QID) | ORAL | 1 refills | Status: DC | PRN

## 2019-11-30 NOTE — Patient Instructions (Signed)
Order MRI of the brain  Check kidney function today See you back in 6 months

## 2019-11-30 NOTE — Progress Notes (Signed)
PATIENT: Jacqueline Rice DOB: [birth year]  REASON FOR VISIT: follow up HISTORY FROM: patient  HISTORY OF PRESENT ILLNESS: Today 11/30/19  HISTORY  Jacqueline Rice is a 54 year old female, seen in request by her primary care physician Dr. Briscoe Deutscher ophthalmologist Dr. Katy Fitch, [name], for evaluation of right facial discomfort, initial evaluation was through virtual visit on October 15, 2018.  I have reviewed and summarized the referring note from the referring physician. She had a history of chickenpox, but never had a history of shingles, over the past few years, she had intermittent right forehead above right eyelid area discomfort, usually short lasting for few weeks it would go away, never had rash broke out  But this time she had similar sensation since March 2020, been ongoing for 3 months, it is painful above right eyelid, extending to right forehead, sensitive to touch, sometimes sharp radiating pain last 5 to 10 minutes, spare right occipital region, the skin is sensitive to touch, shampooing her hair would trigger the pain, she complains of dry eyes, was seen by Dr. Carolynn Sayers, will reported normal,  Laboratory evaluation June 2020 showed normal TSH, C-reactive protein, A1c of 6.0, vitamin D of 22, lipid profile showed cholesterol of 203, LDL of 118, normal CMP, CBC  UPDATE July 9th 2020: She did have mild rash at right eye brown in June, now has much improved, Still has mild dry itching skin, not as sensitive,  She also reported a history of migraine headaches, usually clustered around her menstruation peroid of time, right lateralized moderate pounding headache with associated light, noise sensitivity. She has tried over-the-counter medication with limited help, never tried triptan treatment in the past  Laboratory evaluations in 2020 reviewed, normal TSH, C-reactive protein, CMP, CBC, A1c, vitamin D level was 22  I personally reviewed MRI of the brain with without  contrast on October 28, 2018: Multiple round and ovoid periventricular, subcortical, and pericallosal T2 hyperintensity lesion, some of this are hypointense on T1 views, this raised the possibility of multiple sclerosis   Patient denies a history of strokelike symptoms of sudden visual loss  UPDATE November 24 2018: She had lumbar puncture on November 19, 2018, developed positive lumbar headache, still present, sitting up position for 20 minutes, she would get severe occipital, vertex area headaches, Fioricet provide limited help  CSF showed more than 5 oligoclonal banding, total protein was 49  Personally reviewed MRI of cervical spine C5-6, disc bulging with severe left foraminal stenosis no cord signal changes no evidence of central canal stenosis.  Update May 31, 2019 SS: She denies any MS symptoms, reports her migraines have been doing well, a few weeks ago she did have a headache that lasted the entire weekend, she took Imitrex left over, and Fioricet.  Fioricet works well for her headaches.  She says 2 weeks ago, developed another flare of presumed shingles to her right eyebrow, lower eye, no lesions, just hypersensitivity, paresthesia.  She has been treating with gabapentin with improvement.  No changes in vision. She previously saw Dr. Katy Fitch in the summer, was told no change in vision.  She says she has been getting the flares about once a year for the last 4 years.  She is working full-time from home, processing disability claims.  Update November 30, 2019 SS: When last seen, was treated for presumed shingles flare right V1 area, no lesion, but positive for hypersensitivity and paresthesia, has not had shingles vaccine.  Treated with Valtrex and prednisone.  Saw Dr. Katy Fitch, reported no abnormalities, no change in vision.  Since last seen, no MS symptoms.  Has not taken any gabapentin.  Headaches have improved, 1-2 headaches a few days before her menstrual cycle, taking 1-2 Fioricet a month, with near  immediate relief of headache.  Condition is overall stable, denies any new or worsening symptoms.  REVIEW OF SYSTEMS: Out of a complete 14 system review of symptoms, the patient complains only of the following symptoms, and all other reviewed systems are negative.  N/A  ALLERGIES: Allergies  Allergen Reactions  . Amoxicillin Nausea And Vomiting  . Penicillins Nausea And Vomiting    HOME MEDICATIONS: Outpatient Medications Prior to Visit  Medication Sig Dispense Refill  . Ascorbic Acid (VITAMIN C PO) Take by mouth.    . cetirizine (ZYRTEC) 10 MG tablet Take 10 mg by mouth daily.    . fluticasone (FLONASE) 50 MCG/ACT nasal spray Place into both nostrils daily.    Marland Kitchen gabapentin (NEURONTIN) 100 MG capsule Take 3 capsules (300 mg total) by mouth 3 (three) times daily. 270 capsule 6  . Multiple Vitamin (MULTIVITAMIN ADULT PO) Take by mouth. OTC    . VITAMIN D, ERGOCALCIFEROL, PO Take by mouth.    . butalbital-acetaminophen-caffeine (FIORICET) 50-325-40 MG tablet Take 1 tablet by mouth every 6 (six) hours as needed for headache. 12 tablet 0  . predniSONE (DELTASONE) 20 MG tablet Take 20 mg daily x 3 days, then take 10 mg x 3 days (Patient not taking: Reported on 10/04/2019) 6 tablet 0  . valACYclovir (VALTREX) 1000 MG tablet Take 1 tablet (1,000 mg total) by mouth 3 (three) times daily. 21 tablet 0   No facility-administered medications prior to visit.    PAST MEDICAL HISTORY: Past Medical History:  Diagnosis Date  . Allergy   . Gestational diabetes 2005  . Vitamin D deficiency 10/07/2016    PAST SURGICAL HISTORY: Past Surgical History:  Procedure Laterality Date  . CHOLECYSTECTOMY    . OVARIAN CYST REMOVAL  20 years ago    FAMILY HISTORY: Family History  Problem Relation Age of Onset  . Diabetes Mother   . Mental illness Mother   . High blood pressure Father   . High Cholesterol Father   . Mental illness Brother   . Breast cancer Paternal Aunt   . Healthy Daughter   .  Healthy Son   . Colon cancer Neg Hx   . Esophageal cancer Neg Hx   . Rectal cancer Neg Hx   . Stomach cancer Neg Hx     SOCIAL HISTORY: Social History   Socioeconomic History  . Marital status: Married    Spouse name: Not on file  . Number of children: 2  . Years of education: Not on file  . Highest education level: Not on file  Occupational History  . Occupation: Armed forces training and education officer: [employer]  Tobacco Use  . Smoking status: Never Smoker  . Smokeless tobacco: Never Used  Vaping Use  . Vaping Use: Never used  Substance and Sexual Activity  . Alcohol use: No  . Drug use: No  . Sexual activity: Yes    Birth control/protection: Post-menopausal  Other Topics Concern  . Not on file  Social History Narrative   Right handed    Caffeine 1-2 cups per week   Social Determinants of Health   Financial Resource Strain:   . Difficulty of Paying Living Expenses:   Food Insecurity:   . Worried About Running  Out of Food in the Last Year:   . [place] in the Last Year:   Transportation Needs:   . Lack of Transportation (Medical):   Marland Kitchen Lack of Transportation (Non-Medical):   Physical Activity:   . Days of Exercise per Week:   . Minutes of Exercise per Session:   Stress:   . Feeling of Stress :   Social Connections:   . Frequency of Communication with Friends and Family:   . Frequency of Social Gatherings with Friends and Family:   . Attends Religious Services:   . Active Member of Clubs or Organizations:   . Attends Archivist Meetings:   Marland Kitchen Marital Status:   Intimate Partner Violence:   . Fear of Current or Ex-Partner:   . Emotionally Abused:   Marland Kitchen Physically Abused:   . Sexually Abused:    PHYSICAL EXAM  Vitals:   11/30/19 1014  BP: 118/72  Pulse: 78  Weight: 159 lb (72.1 kg)   Body mass index is 26.46 kg/m.  Generalized: Well developed, in no acute distress   Neurological examination  Mentation: Alert oriented to time, place, history  taking. Follows all commands speech and language fluent Cranial nerve II-XII: Pupils were equal round reactive to light. Extraocular movements were full, visual field were full on confrontational test. Facial sensation and strength were normal. Head turning and shoulder shrug  were normal and symmetric. Twitching noted below left eye lid. Motor: The motor testing reveals 5 over 5 strength of all 4 extremities. Good symmetric motor tone is noted throughout.  Sensory: Sensory testing is intact to soft touch on all 4 extremities. No evidence of extinction is noted.  Coordination: Cerebellar testing reveals good finger-nose-finger and heel-to-shin bilaterally.  Gait and station: Gait is normal. Tandem gait is normal. Romberg is negative. No drift is seen.  Reflexes: Deep tendon reflexes are symmetric but increased throughout  DIAGNOSTIC DATA (LABS, IMAGING, TESTING) - I reviewed patient records, labs, notes, testing and imaging myself where available.  Lab Results  Component Value Date   WBC 5.5 10/04/2019   HGB 12.8 10/04/2019   HCT 38.7 10/04/2019   MCV 84.5 10/04/2019   PLT 267.0 10/04/2019      Component Value Date/Time   NA 136 10/04/2019 0850   K 4.3 10/04/2019 0850   CL 103 10/04/2019 0850   CO2 27 10/04/2019 0850   GLUCOSE 93 10/04/2019 0850   BUN 12 10/04/2019 0850   CREATININE 0.72 10/04/2019 0850   CALCIUM 9.2 10/04/2019 0850   PROT 6.8 10/04/2019 0850   PROT 7.1 11/05/2018 0927   ALBUMIN 4.3 10/04/2019 0850   ALBUMIN 3.7 11/19/2018 0814   AST 15 10/04/2019 0850   ALT 16 10/04/2019 0850   ALKPHOS 42 10/04/2019 0850   BILITOT 0.5 10/04/2019 0850   GFRNONAA 116.64 12/02/2008 1507   Lab Results  Component Value Date   CHOL 220 (H) 10/04/2019   HDL 56.70 10/04/2019   LDLCALC 134 (H) 10/04/2019   TRIG 147.0 10/04/2019   CHOLHDL 4 10/04/2019   Lab Results  Component Value Date   HGBA1C 5.9 10/04/2019   Lab Results  Component Value Date   VITAMINB12 1,046  11/05/2018   Lab Results  Component Value Date   TSH 1.24 10/04/2019      ASSESSMENT AND PLAN 54 y.o. year old female  has a past medical history of Allergy, Gestational diabetes (2005), and Vitamin D deficiency (10/07/2016). here with:  1.  Right forehead area  paresthesia, rash -In February 2021, suggestive of V1 shingles, treated with Valtrex and prednisone, has since received shingles vaccine -Remains on gabapentin 300 mg 3 times daily as needed, has not taken in the last 6 months -Referred to ophthalmology, reported no change in vision  2.  Relapsing remitting multiple sclerosis -Confirmed by abnormal MRI of the brain -CSF showed more than 5 oligoclonal banding -Mild lesion noted on MRI of the brain, no cervical cord lesion -Extensive laboratory evaluation failed to demonstrate etiology -After extensive discussion, has decided to hold off on immunomodulating therapy -We will order repeat MRI of the brain with and without contrast, check serum creatinine today -Follow-up in 6 months or sooner if needed with Dr. Krista Blue  3.  Chronic migraine headache -Fioricet as needed, refill was sent  4.  Vitamin D deficiency -Is on vitamin D supplement  I spent 30 minutes of face-to-face and non-face-to-face time with patient.  This included previsit chart review, lab review, study review, order entry, electronic health record documentation, patient education.  Butler Denmark, AGNP-C, DNP 11/30/2019, 10:43 AM [facility] [address] [place], [place] [address] [phone]

## 2019-12-01 LAB — CREATININE, SERUM
Creatinine, Ser: 0.69 mg/dL (ref 0.57–1.00)
GFR calc Af Amer: 116 mL/min/{1.73_m2} (ref >59)
GFR calc non Af Amer: 100 mL/min/{1.73_m2} (ref >59)

## 2019-12-07 ENCOUNTER — Telehealth: Payer: Self-pay | Admitting: Neurology

## 2019-12-07 NOTE — Telephone Encounter (Signed)
no to the covid questions MR Brain w/wo contrast [place]: [id number] (exp. 12/06/19 to 02/04/20). Patient is scheduled at [facility] for 12/08/19.

## 2019-12-08 ENCOUNTER — Other Ambulatory Visit: Payer: Self-pay

## 2019-12-08 ENCOUNTER — Ambulatory Visit: Payer: 59

## 2019-12-08 DIAGNOSIS — G35 Multiple sclerosis: Secondary | ICD-10-CM

## 2019-12-08 MED ORDER — GADOBENATE DIMEGLUMINE 529 MG/ML IV SOLN
15.0000 mL | Freq: Once | INTRAVENOUS | Status: AC | PRN
  Administered 2019-12-08: 15 mL via INTRAVENOUS

## 2019-12-09 ENCOUNTER — Telehealth: Payer: Self-pay

## 2019-12-09 NOTE — Telephone Encounter (Signed)
Pt had mri done yesterday,  awaiting mri results

## 2019-12-09 NOTE — Telephone Encounter (Signed)
-----   Message from Suzzanne Cloud, NP sent at 12/01/2019  7:59 AM EDT ----- Kidney function looks good for MRI with contrast.

## 2019-12-13 ENCOUNTER — Telehealth: Payer: Self-pay | Admitting: Neurology

## 2019-12-13 NOTE — Telephone Encounter (Signed)
I called the patient.  MRI of the brain is stable from a year ago, the patient is being followed with multiple sclerosis.   MRI brain 12/10/19:  IMPRESSION:   MRI brain (with and without) demonstrating: - Multiple round and ovoid, periventricular, subcortical and peri-callosal T2 hyperintensities. Some of these are hypointense on T1 views. These findings are non-specific and considerations include demyelinating, autoimmune, inflammatory, post-infectious, microvascular ischemic or migraine associated etiologies.  No significant change compared with previous MRI dated 10/28/2018 - No abnormal lesions on post-contrast views.

## 2019-12-13 NOTE — Telephone Encounter (Signed)
-----   Message from Brandon Melnick, RN sent at 12/13/2019  5:27 PM EDT ----- Marlinda Mike out ----- Message ----- From: Garvin Fila, MD Sent: 12/10/2019   8:20 AM EDT To: Suzzanne Cloud, NP

## 2020-02-01 ENCOUNTER — Other Ambulatory Visit: Payer: Self-pay | Admitting: Family Medicine

## 2020-02-01 DIAGNOSIS — Z1231 Encounter for screening mammogram for malignant neoplasm of breast: Secondary | ICD-10-CM

## 2020-02-24 ENCOUNTER — Encounter: Payer: Self-pay | Admitting: Family Medicine

## 2020-02-24 ENCOUNTER — Other Ambulatory Visit: Payer: Self-pay

## 2020-02-24 ENCOUNTER — Ambulatory Visit (INDEPENDENT_AMBULATORY_CARE_PROVIDER_SITE_OTHER): Payer: 59

## 2020-02-24 DIAGNOSIS — Z23 Encounter for immunization: Secondary | ICD-10-CM

## 2020-02-25 ENCOUNTER — Ambulatory Visit
Admission: RE | Admit: 2020-02-25 | Discharge: 2020-02-25 | Disposition: A | Discharge status: Home / Self Care | Payer: 59 | Source: Ambulatory Visit | Attending: Family Medicine | Admitting: Family Medicine

## 2020-02-25 DIAGNOSIS — Z1231 Encounter for screening mammogram for malignant neoplasm of breast: Secondary | ICD-10-CM

## 2020-02-29 NOTE — Progress Notes (Signed)
I have reviewed and agreed above plan. 

## 2020-06-01 ENCOUNTER — Encounter: Payer: Self-pay | Admitting: Neurology

## 2020-06-01 ENCOUNTER — Ambulatory Visit (INDEPENDENT_AMBULATORY_CARE_PROVIDER_SITE_OTHER): Payer: 59 | Admitting: Neurology

## 2020-06-01 VITALS — BP 132/82 | HR 88 | Ht 65.0 in | Wt 158.5 lb

## 2020-06-01 DIAGNOSIS — G35D Multiple sclerosis, unspecified: Secondary | ICD-10-CM

## 2020-06-01 DIAGNOSIS — E559 Vitamin D deficiency, unspecified: Secondary | ICD-10-CM | POA: Diagnosis not present

## 2020-06-01 DIAGNOSIS — G35 Multiple sclerosis: Secondary | ICD-10-CM | POA: Diagnosis not present

## 2020-06-01 DIAGNOSIS — G43709 Chronic migraine without aura, not intractable, without status migrainosus: Secondary | ICD-10-CM | POA: Insufficient documentation

## 2020-06-01 DIAGNOSIS — B0229 Other postherpetic nervous system involvement: Secondary | ICD-10-CM

## 2020-06-01 DIAGNOSIS — G43009 Migraine without aura, not intractable, without status migrainosus: Secondary | ICD-10-CM | POA: Insufficient documentation

## 2020-06-01 MED ORDER — CLONAZEPAM 0.5 MG PO TABS: 0.5000 mg | ORAL_TABLET | Freq: Every evening | ORAL | 1 refills | Status: DC | PRN

## 2020-06-01 MED ORDER — BUTALBITAL-APAP-CAFFEINE 50-325-40 MG PO TABS: 1.0000 | ORAL_TABLET | Freq: Four times a day (QID) | ORAL | 5 refills | Status: DC | PRN

## 2020-06-01 MED ORDER — GABAPENTIN 100 MG PO CAPS: 300.0000 mg | ORAL_CAPSULE | Freq: Three times a day (TID) | ORAL | 6 refills | Status: DC

## 2020-06-01 NOTE — Progress Notes (Signed)
HISTORY OF PRESENT ILLNESS: Jacqueline Rice is a 55 year old female, seen in request by her primary care physician Dr. Briscoe Deutscher ophthalmologist Dr. Katy Fitch, [name], for evaluation of right facial discomfort, initial evaluation was through virtual visit on October 15, 2018.  I have reviewed and summarized the referring note from the referring physician. She had a history of chickenpox, but never had a history of shingles, over the past few years, she had intermittent right forehead above right eyelid area discomfort, usually short lasting for few weeks it would go away, never had rash broke out  But this time she had similar sensation since March 2020, been ongoing for 3 months, it is painful above right eyelid, extending to right forehead, sensitive to touch, sometimes sharp radiating pain last 5 to 10 minutes, spare right occipital region, the skin is sensitive to touch, shampooing her hair would trigger the pain, she complains of dry eyes, was seen by Dr. Carolynn Sayers, will reported normal,  Laboratory evaluation June 2020 showed normal TSH, C-reactive protein, A1c of 6.0, vitamin D of 22, lipid profile showed cholesterol of 203, LDL of 118, normal CMP, CBC  UPDATE July 9th 2020: She did have mild rash at right eye brown in June, now has much improved, Still has mild dry itching skin, not as sensitive,  She reported a history of migraine headaches, usually clustered around her menstruation peroid of time, right lateralized moderate pounding headache with associated light, noise sensitivity. She has tried over-the-counter medication with limited help, never tried triptan treatment in the past  Laboratory evaluations in 2020 reviewed, normal TSH, C-reactive protein, CMP, CBC, A1c, vitamin D level was 22  I personally reviewed MRI of the brain with without contrast on October 28, 2018: Multiple round and ovoid periventricular, subcortical, and pericallosal T2 hyperintensity lesion, some of  this are hypointense on T1 views, this raised the possibility of multiple sclerosis   Patient denies a history of strokelike symptoms of sudden visual loss  UPDATE November 24 2018: She had lumbar puncture on November 19, 2018, developed positive lumbar headache, still present, sitting up position for 20 minutes, she would get severe occipital, vertex area headaches, Fioricet provide limited help  CSF showed more than 5 oligoclonal banding, total protein was 40  Personally reviewed MRI of cervical spine C5-6, disc bulging with severe left foraminal stenosis no cord signal changes no evidence of central canal stenosis.  UPDATE Jun 01 2020: I personally reviewed repeat MRI of the brain December 08, 2019, in comparison to previous MRI in 2020, multiple round oval, periventricular, subcortical T2/flair hyperintensity lesion, no contrast-enhancement, no change compared to previous scan  Previous evaluation including CSF showed more than 5 oligoclonal banding, confirmed the diagnosis of relapsing remitting multiple sclerosis, she chooses not to proceed with any treatment then,  Laboratory evaluation August 2021, A1c 5.9, normal TSH 1.24, lipid panel, LDL 134, cholesterol 220, normal CMP, CBC, hemoglobin of 12.8,  She denies any focal symptoms, has flareup of right frontal area postherpetic neuralgia, responding well to gabapentin, she just lost her husband on May 30 2020, still grieving  REVIEW OF SYSTEMS: Out of a complete 14 system review of symptoms, the patient complains only of the following symptoms, and all other reviewed systems are negative.  N/A  ALLERGIES: Allergies  Allergen Reactions  . Amoxicillin Nausea And Vomiting  . Penicillins Nausea And Vomiting    HOME MEDICATIONS: Outpatient Medications Prior to Visit  Medication Sig Dispense Refill  . Ascorbic Acid (  VITAMIN C PO) Take by mouth.    . butalbital-acetaminophen-caffeine (FIORICET) 50-325-40 MG tablet Take 1 tablet by  mouth every 6 (six) hours as needed for headache. 12 tablet 1  . cetirizine (ZYRTEC) 10 MG tablet Take 10 mg by mouth daily.    . fluticasone (FLONASE) 50 MCG/ACT nasal spray Place into both nostrils daily.    Marland Kitchen gabapentin (NEURONTIN) 100 MG capsule Take 3 capsules (300 mg total) by mouth 3 (three) times daily. 270 capsule 6  . Multiple Vitamin (MULTIVITAMIN ADULT PO) Take by mouth. OTC    . VITAMIN D, ERGOCALCIFEROL, PO Take by mouth.     No facility-administered medications prior to visit.    PAST MEDICAL HISTORY: Past Medical History:  Diagnosis Date  . Allergy   . Gestational diabetes 2005  . Vitamin D deficiency 10/07/2016    PAST SURGICAL HISTORY: Past Surgical History:  Procedure Laterality Date  . CHOLECYSTECTOMY    . OVARIAN CYST REMOVAL  20 years ago    FAMILY HISTORY: Family History  Problem Relation Age of Onset  . Diabetes Mother   . Mental illness Mother   . High blood pressure Father   . High Cholesterol Father   . Mental illness Brother   . Breast cancer Paternal Aunt   . Healthy Daughter   . Healthy Son   . Colon cancer Neg Hx   . Esophageal cancer Neg Hx   . Rectal cancer Neg Hx   . Stomach cancer Neg Hx     SOCIAL HISTORY: Social History   Socioeconomic History  . Marital status: Widowed    Spouse name: Not on file  . Number of children: 2  . Years of education: Not on file  . Highest education level: Not on file  Occupational History  . Occupation: Armed forces training and education officer: [employer]  Tobacco Use  . Smoking status: Never Smoker  . Smokeless tobacco: Never Used  Vaping Use  . Vaping Use: Never used  Substance and Sexual Activity  . Alcohol use: No  . Drug use: No  . Sexual activity: Yes    Birth control/protection: Post-menopausal  Other Topics Concern  . Not on file  Social History Narrative   Right handed    Caffeine 1-2 cups per week   Social Determinants of Health   Financial Resource Strain: Not on file  Food  Insecurity: Not on file  Transportation Needs: Not on file  Physical Activity: Not on file  Stress: Not on file  Social Connections: Not on file  Intimate Partner Violence: Not on file   PHYSICAL EXAM  Vitals:   06/01/20 1054  BP: 132/82  Pulse: 88  Weight: 158 lb 8 oz (71.9 kg)  Height: 5\' 5"  (1.651 m)   Body mass index is 26.38 kg/m.  Generalized: Well developed, in no acute distress   Neurological examination  Mentation: Alert oriented to time, place, history taking. Follows all commands speech and language fluent Cranial nerve II-XII: Pupils were equal round reactive to light. Extraocular movements were full, visual field were full on confrontational test. Facial sensation and strength were normal. Head turning and shoulder shrug  were normal and symmetric. Twitching noted below left eye lid. Motor: The motor testing reveals 5 over 5 strength of all 4 extremities. Good symmetric motor tone is noted throughout.   Coordination: Cerebellar testing reveals good finger-nose-finger and heel-to-shin bilaterally.  Gait and station: Gait is normal. Tandem gait is normal. Romberg is negative. No drift  is seen.  Reflexes: Deep tendon reflexes are symmetric but increased throughout   DIAGNOSTIC DATA (LABS, IMAGING, TESTING) - I reviewed patient records, labs, notes, testing and imaging myself where available.  Lab Results  Component Value Date   WBC 5.5 10/04/2019   HGB 12.8 10/04/2019   HCT 38.7 10/04/2019   MCV 84.5 10/04/2019   PLT 267.0 10/04/2019      Component Value Date/Time   NA 136 10/04/2019 0850   K 4.3 10/04/2019 0850   CL 103 10/04/2019 0850   CO2 27 10/04/2019 0850   GLUCOSE 93 10/04/2019 0850   BUN 12 10/04/2019 0850   CREATININE 0.69 11/30/2019 1050   CALCIUM 9.2 10/04/2019 0850   PROT 6.8 10/04/2019 0850   PROT 7.1 11/05/2018 0927   ALBUMIN 4.3 10/04/2019 0850   ALBUMIN 3.7 11/19/2018 0814   AST 15 10/04/2019 0850   ALT 16 10/04/2019 0850   ALKPHOS 42  10/04/2019 0850   BILITOT 0.5 10/04/2019 0850   GFRNONAA 100 11/30/2019 1050   GFRAA 116 11/30/2019 1050   Lab Results  Component Value Date   CHOL 220 (H) 10/04/2019   HDL 56.70 10/04/2019   LDLCALC 134 (H) 10/04/2019   TRIG 147.0 10/04/2019   CHOLHDL 4 10/04/2019   Lab Results  Component Value Date   HGBA1C 5.9 10/04/2019   Lab Results  Component Value Date   VITAMINB12 1,046 11/05/2018   Lab Results  Component Value Date   TSH 1.24 10/04/2019      ASSESSMENT AND PLAN 55 y.o. year old female   Right forehead area paresthesia, rash  -In March 2020, suggestive of V1 shingles, treated with Valtrex and prednisone, has since received shingles vaccine  Flareup of postherpetic neuralgia, refilled her gabapentin  Clonazepam as needed  Relapsing remitting multiple sclerosis  -Confirmed by abnormal MRI of the brain  -CSF showed more than 5 oligoclonal banding  -Mild lesion noted on MRI of the brain, no cervical cord lesion  -Extensive laboratory evaluation failed to demonstrate etiology  Repeat MRI of the brain with without contrast in August 2021 showed no significant change compared to previous MRI in July 2020,  After discussed with patient, will hold off immunomodulation therapy at this time  MRI of the brain with without contrast 2022  Chronic migraine headache  -Fioricet as needed, refill was sent  Vitamin D deficiency  Is on vitamin D supplement    Marcial Pacas, M.D. Ph.D.  [facility] [place], [place] [address] Phone: [phone] Fax:      [phone]

## 2020-06-05 ENCOUNTER — Telehealth: Payer: Self-pay | Admitting: Neurology

## 2020-06-05 NOTE — Telephone Encounter (Signed)
no to the covid quetions MR Brain w/wo contrast Dr. Krista Blue [facility] [place]: [id number] (exp. 06/05/20 to 08/04/20). Patient is scheduled at [facility] for 06/06/20.

## 2020-06-06 ENCOUNTER — Ambulatory Visit: Payer: 59

## 2020-06-06 ENCOUNTER — Other Ambulatory Visit: Payer: Self-pay

## 2020-06-06 DIAGNOSIS — E559 Vitamin D deficiency, unspecified: Secondary | ICD-10-CM

## 2020-06-06 DIAGNOSIS — G35 Multiple sclerosis: Secondary | ICD-10-CM | POA: Diagnosis not present

## 2020-06-06 DIAGNOSIS — G43709 Chronic migraine without aura, not intractable, without status migrainosus: Secondary | ICD-10-CM

## 2020-06-06 MED ORDER — GADOBENATE DIMEGLUMINE 529 MG/ML IV SOLN
15.0000 mL | Freq: Once | INTRAVENOUS | Status: AC | PRN
Start: 2020-06-06 — End: 2020-06-06
  Administered 2020-06-06: 15 mL via INTRAVENOUS

## 2020-10-04 ENCOUNTER — Encounter: Payer: Self-pay | Admitting: Family Medicine

## 2020-10-04 ENCOUNTER — Other Ambulatory Visit: Payer: Self-pay

## 2020-10-04 ENCOUNTER — Ambulatory Visit (INDEPENDENT_AMBULATORY_CARE_PROVIDER_SITE_OTHER): Payer: 59 | Admitting: Family Medicine

## 2020-10-04 VITALS — BP 124/80 | HR 87 | Temp 98.0°F | Ht 65.0 in | Wt 164.2 lb

## 2020-10-04 DIAGNOSIS — Z0000 Encounter for general adult medical examination without abnormal findings: Secondary | ICD-10-CM | POA: Diagnosis not present

## 2020-10-04 DIAGNOSIS — B0229 Other postherpetic nervous system involvement: Secondary | ICD-10-CM | POA: Diagnosis not present

## 2020-10-04 DIAGNOSIS — F4321 Adjustment disorder with depressed mood: Secondary | ICD-10-CM

## 2020-10-04 DIAGNOSIS — G35 Multiple sclerosis: Secondary | ICD-10-CM

## 2020-10-04 DIAGNOSIS — Z8632 Personal history of gestational diabetes: Secondary | ICD-10-CM

## 2020-10-04 DIAGNOSIS — E559 Vitamin D deficiency, unspecified: Secondary | ICD-10-CM | POA: Diagnosis not present

## 2020-10-04 LAB — LIPID PANEL
Cholesterol: 191 mg/dL (ref 0–200)
HDL: 57.3 mg/dL (ref >39.00)
NonHDL: 133.39
Total CHOL/HDL Ratio: 3
Triglycerides: 213 mg/dL — ABNORMAL HIGH (ref 0.0–149.0)
VLDL: 42.6 mg/dL — ABNORMAL HIGH (ref 0.0–40.0)

## 2020-10-04 LAB — CBC WITH DIFFERENTIAL/PLATELET
Basophils Absolute: 0 10*3/uL (ref 0.0–0.1)
Basophils Relative: 0.4 % (ref 0.0–3.0)
Eosinophils Absolute: 0.2 10*3/uL (ref 0.0–0.7)
Eosinophils Relative: 3.4 % (ref 0.0–5.0)
HCT: 39.1 % (ref 36.0–46.0)
Hemoglobin: 12.9 g/dL (ref 12.0–15.0)
Lymphocytes Relative: 22 % (ref 12.0–46.0)
Lymphs Abs: 1.6 10*3/uL (ref 0.7–4.0)
MCHC: 33 g/dL (ref 30.0–36.0)
MCV: 82.2 fl (ref 78.0–100.0)
Monocytes Absolute: 0.4 10*3/uL (ref 0.1–1.0)
Monocytes Relative: 5.5 % (ref 3.0–12.0)
Neutro Abs: 5 10*3/uL (ref 1.4–7.7)
Neutrophils Relative %: 68.7 % (ref 43.0–77.0)
Platelets: 239 10*3/uL (ref 150.0–400.0)
RBC: 4.76 Mil/uL (ref 3.87–5.11)
RDW: 13.8 % (ref 11.5–15.5)
WBC: 7.2 10*3/uL (ref 4.0–10.5)

## 2020-10-04 LAB — COMPREHENSIVE METABOLIC PANEL
ALT: 15 U/L (ref 0–35)
AST: 15 U/L (ref 0–37)
Albumin: 4 g/dL (ref 3.5–5.2)
Alkaline Phosphatase: 43 U/L (ref 39–117)
BUN: 13 mg/dL (ref 6–23)
CO2: 27 mEq/L (ref 19–32)
Calcium: 8.9 mg/dL (ref 8.4–10.5)
Chloride: 104 mEq/L (ref 96–112)
Creatinine, Ser: 0.81 mg/dL (ref 0.40–1.20)
GFR: 82.76 mL/min (ref >60.00)
Glucose, Bld: 97 mg/dL (ref 70–99)
Potassium: 4.1 mEq/L (ref 3.5–5.1)
Sodium: 137 mEq/L (ref 135–145)
Total Bilirubin: 0.6 mg/dL (ref 0.2–1.2)
Total Protein: 7 g/dL (ref 6.0–8.3)

## 2020-10-04 LAB — TSH: TSH: 1.6 u[IU]/mL (ref 0.35–4.50)

## 2020-10-04 LAB — LDL CHOLESTEROL, DIRECT: Direct LDL: 110 mg/dL

## 2020-10-04 LAB — HEMOGLOBIN A1C: Hgb A1c MFr Bld: 6.3 % (ref 4.6–6.5)

## 2020-10-04 MED ORDER — SERTRALINE HCL 25 MG PO TABS: ORAL_TABLET | ORAL | 2 refills | Status: DC

## 2020-10-04 NOTE — Patient Instructions (Signed)
Please return in 6 weeks to recheck mood.   I will release your lab results to you on your MyChart account with further instructions. Please reply with any questions.   If you have any questions or concerns, please don't hesitate to send me a message via MyChart or call the office at [phone]. Thank you for visiting with [place] today! It's our pleasure caring for you.  Please call [facility] to schedule an appointment. The phone number is: [phone]  Managing Loss, Adult People experience loss in many different ways throughout their lives. Events such as moving, changing jobs, and losing friends can create a sense of loss. The loss may be as serious as a major health change, divorce, death of a pet, or death of a loved one. All of these types of loss are likely to create a physical and emotional reaction known as grief. Grief is the result of a major change or an absence of something or someone that you count on. Grief is a normal reaction to loss. A variety of factors can affect your grieving experience, including:  The nature of your loss.  Your relationship to what or whom you lost.  Your understanding of grief and how to manage it.  Your support system. How to manage lifestyle changes Keep to your normal routine as much as possible.  If you have trouble focusing or doing normal activities, it is acceptable to take some time away from your normal routine.  Spend time with friends and loved ones.  Eat a healthy diet, get plenty of sleep, and rest when you feel tired.   How to recognize changes  The way that you deal with your grief will affect your ability to function as you normally do. When grieving, you may experience these changes:  Numbness, shock, sadness, anxiety, anger, denial, and guilt.  Thoughts about death.  Unexpected crying.  A physical sensation of emptiness in your stomach.  Problems sleeping and eating.  Tiredness (fatigue).  Loss  of interest in normal activities.  Dreaming about or imagining seeing the person who died.  A need to remember what or whom you lost.  Difficulty thinking about anything other than your loss for a period of time.  Relief. If you have been expecting the loss for a while, you may feel a sense of relief when it happens. Follow these instructions at home: Activity Express your feelings in healthy ways, such as:  Talking with others about your loss. It may be helpful to find others who have had a similar loss, such as a support group.  Writing down your feelings in a journal.  Doing physical activities to release stress and emotional energy.  Doing creative activities like painting, sculpting, or playing or listening to music.  Practicing resilience. This is the ability to recover and adjust after facing challenges. Reading some resources that encourage resilience may help you to learn ways to practice those behaviors.   General instructions  Be patient with yourself and others. Allow the grieving process to happen, and remember that grieving takes time. ? It is likely that you may never feel completely done with some grief. You may find a way to move on while still cherishing memories and feelings about your loss. ? Accepting your loss is a process. It can take months or longer to adjust.  Keep all follow-up visits as told by your health care provider. This is important. Where to find support To get support for managing loss:  Ask your health care provider for help and recommendations, such as grief counseling or therapy.  Think about joining a support group for people who are managing a loss. Where to find more information You can find more information about managing loss from:  American Society of Clinical Oncology: www.cancer.net  American Psychological Association: TVStereos.ch Contact a health care provider if:  Your grief is extreme and keeps getting worse.  You have  ongoing grief that does not improve.  Your body shows symptoms of grief, such as illness.  You feel depressed, anxious, or lonely. Get help right away if:  You have thoughts about hurting yourself or others. If you ever feel like you may hurt yourself or others, or have thoughts about taking your own life, get help right away. You can go to your nearest emergency department or call:  Your local emergency services (911 in the [place]  A suicide crisis helpline, such as the [facility] at 228 429 2360. This is open 24 hours a day. Summary  Grief is the result of a major change or an absence of someone or something that you count on. Grief is a normal reaction to loss.  The depth of grief and the period of recovery depend on the type of loss and your ability to adjust to the change and process your feelings.  Processing grief requires patience and a willingness to accept your feelings and talk about your loss with people who are supportive.  It is important to find resources that work for you and to realize that people experience grief differently. There is not one grieving process that works for everyone in the same way.  Be aware that when grief becomes extreme, it can lead to more severe issues like isolation, depression, anxiety, or suicidal thoughts. Talk with your health care provider if you have any of these issues. This information is not intended to replace advice given to you by your health care provider. Make sure you discuss any questions you have with your health care provider. Document Revised: 10/07/2019 Document Reviewed: 10/07/2019 Elsevier Patient Education  [place].

## 2020-10-04 NOTE — Progress Notes (Signed)
Subjective  Chief Complaint  Patient presents with  . Annual Exam    Fasting.    HPI: Jacqueline Rice is a 55 y.o. female who presents to [facility] at [facility] today for a Female Wellness Visit. She also has the concerns and/or needs as listed above in the chief complaint. These will be addressed in addition to the Health Maintenance Visit.   Wellness Visit: annual visit with health maintenance review and exam without Pap   Health maintenance: Screens are up-to-date.  Immunizations are up-to-date.   Chronic disease f/u and/or acute problem visit: (deemed necessary to be done in addition to the wellness visit):  55 year old female who unfortunately suffered the loss of her husband back in February.  He was a heart transplant patient and cancer patient.  Ultimately, kidney cancer and that his life after a month-long course in the hospital.  Patient is understandably grieving.  Her depression screen below is understandably positive.  She is working but not at full capacity.  She is trying to keep things together for the sake of her 54 year old twins.  She reports her sleep is fair but her appetite is back to normal.  She is struggling to take care of herself.  She is working with a hospice grief counselor but finds herself quite overwhelmed.  Her husband took care of many things for her and her family and she is struggling to learn how to manage it all herself.  She has a history of depression and was treated with Zoloft in the past.  Her daughter has depression and is on Zoloft.  She reports it worked well for her.  She does not have suicidal ideation or anhedonia.  She is open to seeing a new therapist..  Depression screen [facility] 2/9 10/04/2020 10/02/2018 09/30/2017 09/30/2016  Decreased Interest 3 0 0 0  Down, Depressed, Hopeless 3 2 0 0  PHQ - 2 Score 6 2 0 0  Altered sleeping 3 0 - -  Tired, decreased energy 3 0 - -  Change in appetite 3 2 - -  Feeling bad or failure about  yourself  3 2 - -  Trouble concentrating 2 2 - -  Moving slowly or fidgety/restless 1 0 - -  Suicidal thoughts 0 0 - -  PHQ-9 Score 21 8 - -  Difficult doing work/chores Very difficult Somewhat difficult - -    Multiple sclerosis: Reviewed neurology notes from March.  Had recent brain MRI which did not show any changes.  She remains asymptomatic.  Postherpetic neuralgia: Started on gabapentin and having good results.  Migraines: Fortunately has been stable even with stressors of recent loss of her husband.  Rare use of pain medications.  History of gestational diabetes without symptoms of hyperglycemia.  She does report her diet has been worse however.   Assessment/Plan:   1. Annual physical exam   2. Multiple sclerosis ([place])   3. Vitamin D deficiency   4. History of gestational diabetes   5. Postherpetic neuralgia   6. Complicated grief      Plan  Female Wellness Visit:  Age appropriate Health Maintenance and Prevention measures were discussed with patient. Included topics are cancer screening recommendations, ways to keep healthy (see AVS) including dietary and exercise recommendations, regular eye and dental care, use of seat belts, and avoidance of moderate alcohol use and tobacco use.  Screens are up-to-date  BMI: discussed patient's BMI and encouraged positive lifestyle modifications to help get to or  maintain a target BMI.  HM needs and immunizations were addressed and ordered. See below for orders. See HM and immunization section for updates.  Routine labs and screening tests ordered including cmp, cbc and lipids where appropriate.  Discussed recommendations regarding Vit D and calcium supplementation (see AVS)  Chronic disease management visit and/or acute problem visit:  Complicated grief with depression: Counseling and education given.  Refer to psychotherapy.  Start Zoloft.  Close follow-up.  Continue with hospice counseling.  Postherpetic neuralgia, migraines  and MS: Fortunately all stable at this time.  No changes in medications.  Continue gabapentin as needed.  Continue as needed migraine medicines.  Has annual follow-up with neurology.  Screen for diabetes given history of gestational diabetes.   Follow up: 6 weeks to recheck mood Orders Placed This Encounter  Procedures  . CBC with Differential/Platelet  . Lipid panel  . TSH  . Hemoglobin A1c  . Comp Met (CMET)   Meds ordered this encounter  Medications  . sertraline (ZOLOFT) 25 MG tablet    Sig: Take 1 tab (73m) daily for 2 weeks, then increase to 2 tabs (59m daily    Dispense:  60 tablet    Refill:  2      Body mass index is 27.32 kg/m. Wt Readings from Last 3 Encounters:  10/04/20 164 lb 3.2 oz (74.5 kg)  06/01/20 158 lb 8 oz (71.9 kg)  11/30/19 159 lb (72.1 kg)     Patient Active Problem List   Diagnosis Date Noted  . Chronic migraine w/o aura w/o status migrainosus, not intractable 06/01/2020  . Postherpetic neuralgia 06/01/2020  . [place]  . Chronic migraine 11/05/2018  . Insulin resistance 10/02/2018  . Vitamin D deficiency 10/07/2016   Health Maintenance  Topic Date Due  . INFLUENZA VACCINE  11/27/2020  . MAMMOGRAM  02/24/2021  . PAP SMEAR-Modifier  10/07/2021  . TETANUS/TDAP  10/01/2027  . COLONOSCOPY (Pts [id number] coverage will need to be confirmed)  02/24/2028  . COVID-19 Vaccine  Completed  . Hepatitis C Screening  Completed  . HIV Screening  Completed  . Zoster Vaccines- Shingrix  Completed  . Pneumococcal Vaccine 0-642 43ars old  Aged Out  . HPV VACCINES  Aged Out   Immunization History  Administered Date(s) Administered  . Influenza Inj Mdck Quad Pf 02/14/2018  . Influenza,inj,Quad PF,6+ Mos 02/02/2017, 02/04/2019, 02/24/2020  . PFIZER Comirnaty(Gray Top)Covid-19 Tri-Sucrose Vaccine 08/19/2020  . PFIZER(Purple Top)SARS-COV-2 Vaccination 07/04/2019, 07/25/2019, 03/11/2020  . Tdap 09/30/2017  . Zoster  Recombinat (Shingrix) 08/17/2019, 10/27/2019   We updated and reviewed the patient's past history in detail and it is documented below. Allergies: Patient is allergic to amoxicillin and penicillins. Past Medical History Patient  has a past medical history of Allergy, Gestational diabetes (2005), and Vitamin D deficiency (10/07/2016). Past Surgical History Patient  has a past surgical history that includes Ovarian cyst removal (20 years ago) and Cholecystectomy. Family History: Patient family history includes Breast cancer in her paternal aunt; Diabetes in her mother; Healthy in her daughter and son; High Cholesterol in her father; High blood pressure in her father; Mental illness in her brother and mother. Social History:  Patient  reports that she has never smoked. She has never used smokeless tobacco. She reports that she does not drink alcohol and does not use drugs.  Review of Systems: Constitutional: negative for fever or malaise Ophthalmic: negative for photophobia, double vision or loss of vision Cardiovascular: negative for chest pain, dyspnea  on exertion, or new LE swelling Respiratory: negative for SOB or persistent cough Gastrointestinal: negative for abdominal pain, change in bowel habits or melena Genitourinary: negative for dysuria or gross hematuria, no abnormal uterine bleeding or disharge Musculoskeletal: negative for new gait disturbance or muscular weakness Integumentary: negative for new or persistent rashes, no breast lumps Neurological: negative for TIA or stroke symptoms Psychiatric: negative for SI or delusions Allergic/Immunologic: negative for hives  Patient Care Team    Relationship Specialty Notifications Start End  Leamon Arnt, MD PCP - General Family Medicine  10/04/19   Debbra Riding, MD Consulting Physician Ophthalmology  10/12/18   Marcial Pacas, MD Consulting Physician Neurology  10/04/19     Objective  Vitals: BP 124/80   Pulse 87   Temp 98 F  (36.7 C) (Temporal)   Ht '5\' 5"'  (1.651 m)   Wt 164 lb 3.2 oz (74.5 kg)   SpO2 97%   BMI 27.32 kg/m  General:  Well developed, well nourished, crying. Psych:  Alert and orientedx3, flat mood and affect, good insight HEENT:  Normocephalic, atraumatic, non-icteric sclera,  supple neck without adenopathy, mass or thyromegaly Cardiovascular:  Normal S1, S2, RRR without gallop, rub or murmur Respiratory:  Good breath sounds bilaterally, CTAB with normal respiratory effort Gastrointestinal: normal bowel sounds, soft, non-tender, no noted masses. No HSM MSK: no deformities, contusions. Joints are without erythema or swelling.  Skin:  Warm, no rashes or suspicious lesions noted Neurologic:    Mental status is normal. CN 2-11 are normal. Gross motor and sensory exams are normal. Normal gait. No tremor Breast Exam: No mass, skin retraction or nipple discharge is appreciated in either breast. No axillary adenopathy. Fibrocystic changes are not noted   Commons side effects, risks, benefits, and alternatives for medications and treatment plan prescribed today were discussed, and the patient expressed understanding of the given instructions. Patient is instructed to call or message via MyChart if he/she has any questions or concerns regarding our treatment plan. No barriers to understanding were identified. We discussed Red Flag symptoms and signs in detail. Patient expressed understanding regarding what to do in case of urgent or emergency type symptoms.   Medication list was reconciled, printed and provided to the patient in AVS. Patient instructions and summary information was reviewed with the patient as documented in the AVS. This note was prepared with assistance of Dragon voice recognition software. Occasional wrong-word or sound-a-like substitutions may have occurred due to the inherent limitations of voice recognition software  This visit occurred during the SARS-CoV-2 public health emergency.   Safety protocols were in place, including screening questions prior to the visit, additional usage of staff PPE, and extensive cleaning of exam room while observing appropriate contact time as indicated for disinfecting solutions.

## 2020-10-05 ENCOUNTER — Encounter: Payer: Self-pay | Admitting: Family Medicine

## 2020-10-29 ENCOUNTER — Other Ambulatory Visit: Payer: Self-pay | Admitting: Family Medicine

## 2020-11-16 ENCOUNTER — Ambulatory Visit (INDEPENDENT_AMBULATORY_CARE_PROVIDER_SITE_OTHER): Payer: 59 | Admitting: Family Medicine

## 2020-11-16 ENCOUNTER — Encounter: Payer: Self-pay | Admitting: Family Medicine

## 2020-11-16 ENCOUNTER — Other Ambulatory Visit: Payer: Self-pay

## 2020-11-16 VITALS — BP 106/67 | HR 74 | Temp 97.6°F | Ht 65.0 in | Wt 161.6 lb

## 2020-11-16 DIAGNOSIS — Z634 Disappearance and death of family member: Secondary | ICD-10-CM

## 2020-11-16 DIAGNOSIS — F4321 Adjustment disorder with depressed mood: Secondary | ICD-10-CM

## 2020-11-16 MED ORDER — SERTRALINE HCL 50 MG PO TABS: 50.0000 mg | ORAL_TABLET | Freq: Every day | ORAL | 3 refills | Status: DC

## 2020-11-16 NOTE — Progress Notes (Signed)
Subjective  CC:  Chief Complaint  Patient presents with   Follow-up    Rx zoloft  Patient stated improving with medication     HPI: Jacqueline Rice is a 55 y.o. female who presents to the office today to address the problems listed above in the chief complaint, mood problems. Grief: see last note; widowed in July 26, 2022. Started zolfot 6 weeks ago and feeling better. Less down, less overwhelmed; trying to get into the new routine. Children are doing ok. Still with hospice grief counselor. No Aes from meds. Not needing klonopin for sleep.   Depression screen [facility] 2/9 11/16/2020 10/04/2020 10/02/2018  Decreased Interest 0 3 0  Down, Depressed, Hopeless 0 3 2  PHQ - 2 Score 0 6 2  Altered sleeping 0 3 0  Tired, decreased energy 0 3 0  Change in appetite 0 3 2  Feeling bad or failure about yourself  0 3 2  Trouble concentrating 0 2 2  Moving slowly or fidgety/restless 0 1 0  Suicidal thoughts 0 0 0  PHQ-9 Score 0 21 8  Difficult doing work/chores - Very difficult Somewhat difficult     Assessment  1. Complicated grief   2. Death of husband   3. Widowed 2020-07-25      Plan  grief:  improved. Continue Zoloft 6-12 months. If worsening, consider f/u appt for dose change. Continue counseling.  Reviewed concept of mood problems caused by biochemical imbalance of neurotransmitters and rationale for treatment with medications and therapy.  Counseling given: pt was instructed to contact office, on-call physician or crisis Hotline if symptoms worsen significantly. If patient develops any suicidal or homicidal thoughts, she is directed to the ER immediately.   Follow up: No follow-ups on file.  No orders of the defined types were placed in this encounter.  Meds ordered this encounter  Medications   sertraline (ZOLOFT) 50 MG tablet    Sig: Take 1 tablet (50 mg total) by mouth daily.    Dispense:  90 tablet    Refill:  3      I reviewed the patients updated PMH, FH, and SocHx.    Patient  Active Problem List   Diagnosis Date Noted   Widowed 07-25-2020 11/16/2020   Chronic migraine w/o aura w/o status migrainosus, not intractable 06/01/2020   Postherpetic neuralgia 06/01/2020   Multiple sclerosis ([place]) 11/24/2018   Chronic migraine 11/05/2018   Prediabetes 10/02/2018   Vitamin D deficiency 10/07/2016   Current Meds  Medication Sig   Ascorbic Acid (VITAMIN C PO) Take by mouth.   butalbital-acetaminophen-caffeine (FIORICET) 50-325-40 MG tablet Take 1 tablet by mouth every 6 (six) hours as needed for headache.   cetirizine (ZYRTEC) 10 MG tablet Take 10 mg by mouth daily.   clonazePAM (KLONOPIN) 0.5 MG tablet Take 1 tablet (0.5 mg total) by mouth at bedtime as needed for anxiety.   fluticasone (FLONASE) 50 MCG/ACT nasal spray Place into both nostrils daily.   gabapentin (NEURONTIN) 100 MG capsule Take 3 capsules (300 mg total) by mouth 3 (three) times daily. (Patient taking differently: Take 300 mg by mouth 3 (three) times daily as needed.)   Multiple Vitamin (MULTIVITAMIN ADULT PO) Take by mouth. OTC   VITAMIN D, ERGOCALCIFEROL, PO Take by mouth.   [DISCONTINUED] sertraline (ZOLOFT) 25 MG tablet TAKE 1 TAB ('25MG'$ ) DAILY FOR 2 WEEKS, THEN INCREASE TO 2 TABS ('50MG'$ ) DAILY    Allergies: Patient is allergic to amoxicillin and penicillins. Family history:  Patient family history  includes Breast cancer in her paternal aunt; Diabetes in her mother; Healthy in her daughter and son; High Cholesterol in her father; High blood pressure in her father; Mental illness in her brother and mother. Social History   Socioeconomic History   Marital status: Widowed    Spouse name: Not on file   Number of children: 2   Years of education: Not on file   Highest education level: Not on file  Occupational History   Occupation: Armed forces training and education officer: [employer]  Tobacco Use   Smoking status: Never   Smokeless tobacco: Never  Vaping Use   Vaping Use: Never used  Substance and Sexual  Activity   Alcohol use: No   Drug use: No   Sexual activity: Yes    Birth control/protection: Post-menopausal  Other Topics Concern   Not on file  Social History Narrative   Right handed    Caffeine 1-2 cups per week   Social Determinants of Health   Financial Resource Strain: Not on file  Food Insecurity: Not on file  Transportation Needs: Not on file  Physical Activity: Not on file  Stress: Not on file  Social Connections: Not on file     Review of Systems: Constitutional: Negative for fever malaise or anorexia Cardiovascular: negative for chest pain Respiratory: negative for SOB or persistent cough Gastrointestinal: negative for abdominal pain  Objective  Vitals: BP 106/67   Pulse 74   Temp 97.6 F (36.4 C) (Temporal)   Ht '5\' 5"'$  (1.651 m)   Wt 161 lb 9.6 oz (73.3 kg)   LMP 11/09/2020   SpO2 96%   BMI 26.89 kg/m  General: no acute distress, well appearing, no apparent distress, well groomed Psych:  Alert and oriented x 3,normal mood, behavior, speech, dress, and thought processes.    Commons side effects, risks, benefits, and alternatives for medications and treatment plan prescribed today were discussed, and the patient expressed understanding of the given instructions. Patient is instructed to call or message via MyChart if he/she has any questions or concerns regarding our treatment plan. No barriers to understanding were identified. We discussed Red Flag symptoms and signs in detail. Patient expressed understanding regarding what to do in case of urgent or emergency type symptoms.  Medication list was reconciled, printed and provided to the patient in AVS. Patient instructions and summary information was reviewed with the patient as documented in the AVS. This note was prepared with assistance of Dragon voice recognition software. Occasional wrong-word or sound-a-like substitutions may have occurred due to the inherent limitations of voice recognition  software

## 2020-11-16 NOTE — Patient Instructions (Addendum)
Please return in June 2023 for your physical; you may schedule a follow up for mood in 3-6 months if needed.   Keep taking the zoloft as we discussed.  Take care of yourself and I'm glad you are feeling a bit better.   If you have any questions or concerns, please don't hesitate to send me a message via MyChart or call the office at [phone]. Thank you for visiting with [place] today! It's our pleasure caring for you.

## 2021-01-15 ENCOUNTER — Other Ambulatory Visit: Payer: Self-pay | Admitting: Neurology

## 2021-02-15 ENCOUNTER — Ambulatory Visit (INDEPENDENT_AMBULATORY_CARE_PROVIDER_SITE_OTHER): Payer: 59

## 2021-02-15 ENCOUNTER — Other Ambulatory Visit: Payer: Self-pay

## 2021-02-15 DIAGNOSIS — Z23 Encounter for immunization: Secondary | ICD-10-CM | POA: Diagnosis not present

## 2021-03-15 ENCOUNTER — Other Ambulatory Visit: Payer: Self-pay | Admitting: Family Medicine

## 2021-03-15 DIAGNOSIS — Z1231 Encounter for screening mammogram for malignant neoplasm of breast: Secondary | ICD-10-CM

## 2021-04-18 ENCOUNTER — Ambulatory Visit
Admission: RE | Admit: 2021-04-18 | Discharge: 2021-04-18 | Disposition: A | Discharge status: Home / Self Care | Payer: 59 | Source: Ambulatory Visit | Attending: Family Medicine | Admitting: Family Medicine

## 2021-04-18 DIAGNOSIS — Z1231 Encounter for screening mammogram for malignant neoplasm of breast: Secondary | ICD-10-CM

## 2021-06-07 ENCOUNTER — Ambulatory Visit (INDEPENDENT_AMBULATORY_CARE_PROVIDER_SITE_OTHER): Payer: 59 | Admitting: Neurology

## 2021-06-07 ENCOUNTER — Other Ambulatory Visit: Payer: Self-pay

## 2021-06-07 ENCOUNTER — Encounter: Payer: Self-pay | Admitting: Neurology

## 2021-06-07 VITALS — BP 116/73 | HR 55 | Ht 65.0 in | Wt 173.5 lb

## 2021-06-07 DIAGNOSIS — M25521 Pain in right elbow: Secondary | ICD-10-CM | POA: Diagnosis not present

## 2021-06-07 DIAGNOSIS — G35 Multiple sclerosis: Secondary | ICD-10-CM

## 2021-06-07 NOTE — Progress Notes (Addendum)
HISTORY OF PRESENT ILLNESS: Jacqueline Rice is a 56 year old female, seen in request by her primary care physician Dr. Briscoe Deutscher ophthalmologist Dr. Katy Fitch, [name], for evaluation of right facial discomfort, initial evaluation was through virtual visit on October 15, 2018.   I have reviewed and summarized the referring note from the referring physician.  She had a history of chickenpox, but never had a history of shingles, over the past few years, she had intermittent right forehead above right eyelid area discomfort, usually short lasting for few weeks it would go away, never had rash broke out   But this time she had similar sensation since March 2020, been ongoing for 3 months, it is painful above right eyelid, extending to right forehead, sensitive to touch, sometimes sharp radiating pain last 5 to 10 minutes, spare right occipital region, the skin is sensitive to touch,  shampooing her hair would trigger the pain, she complains of dry eyes, was seen by Dr. Carolynn Sayers, will reported normal,   Laboratory evaluation June 2020 showed normal TSH, C-reactive protein, A1c of 6.0, vitamin D of 22, lipid profile showed cholesterol of 203, LDL of 118, normal CMP, CBC   UPDATE July 9th 2020: She did have mild rash at right eye brown in June, now has much improved,  Still has mild dry itching skin, not as sensitive,  She reported a history of migraine headaches, usually clustered around her menstruation peroid of time, right lateralized moderate pounding headache with associated light, noise sensitivity.  She has tried over-the-counter medication with limited help, never tried triptan treatment in the past   Laboratory evaluations in 2020 reviewed, normal TSH, C-reactive protein, CMP, CBC, A1c, vitamin D level was 22   I personally reviewed MRI of the brain with without contrast on October 28, 2018: Multiple round and ovoid periventricular, subcortical, and pericallosal T2 hyperintensity lesion, some of  this are hypointense on T1 views, this raised the possibility of multiple sclerosis    Patient denies a history of strokelike symptoms of sudden visual loss   UPDATE November 24 2018: She had lumbar puncture on November 19, 2018, developed positive lumbar headache, still present, sitting up position for 20 minutes, she would get severe occipital, vertex area headaches, Fioricet provide limited help   CSF showed more than 5 oligoclonal banding, total protein was 15   Personally reviewed MRI of cervical spine C5-6, disc bulging with severe left foraminal stenosis no cord signal changes no evidence of central canal stenosis.  UPDATE Jun 01 2020: I personally reviewed repeat MRI of the brain December 08, 2019, in comparison to previous MRI in 2020, multiple round oval, periventricular, subcortical T2/flair hyperintensity lesion, no contrast-enhancement, no change compared to previous scan  Previous evaluation including CSF showed more than 5 oligoclonal banding, confirmed the diagnosis of relapsing remitting multiple sclerosis, she chooses not to proceed with any treatment then,  Laboratory evaluation August 2021, A1c 5.9, normal TSH 1.24, lipid panel, LDL 134, cholesterol 220, normal CMP, CBC, hemoglobin of 12.8,  She denies any focal symptoms, has flareup of right frontal area postherpetic neuralgia, responding well to gabapentin, she just lost her husband on May 30 2020, still grieving  UPDATE Jun 07 2021: She works from home, desk job, 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, denies bowel or bladder incontinence, she complains of since January 2023, she began to notice right elbow pain, and wrist pain, worried about the possibility of recurrent MS,   We personally reviewed MRI of the brain most recent  in February 2022, multiple stable supratentorium lesions  MRI of cervical spine in July 2020, no spinal cord involvement, but today's examination she is clearly hyperreflexia, bilateral Babinski signs,  REVIEW OF  SYSTEMS: Out of a complete 14 system review of symptoms, the patient complains only of the following symptoms, and all other reviewed systems are negative.  N/A  ALLERGIES: Allergies  Allergen Reactions   Amoxicillin Nausea And Vomiting   Penicillins Nausea And Vomiting    HOME MEDICATIONS: Outpatient Medications Prior to Visit  Medication Sig Dispense Refill   Ascorbic Acid (VITAMIN C PO) Take by mouth.     butalbital-acetaminophen-caffeine (FIORICET) 50-325-40 MG tablet Take 1 tablet by mouth every 6 (six) hours as needed for headache. 12 tablet 5   cetirizine (ZYRTEC) 10 MG tablet Take 10 mg by mouth daily.     clonazePAM (KLONOPIN) 0.5 MG tablet Take 1 tablet (0.5 mg total) by mouth at bedtime as needed for anxiety. 30 tablet 1   fluticasone (FLONASE) 50 MCG/ACT nasal spray Place into both nostrils daily.     gabapentin (NEURONTIN) 100 MG capsule Take 3 capsules (300 mg total) by mouth 3 (three) times daily. (Patient taking differently: Take 300 mg by mouth 3 (three) times daily as needed.) 270 capsule 6   Multiple Vitamin (MULTIVITAMIN ADULT PO) Take by mouth. OTC     sertraline (ZOLOFT) 50 MG tablet Take 1 tablet (50 mg total) by mouth daily. 90 tablet 3   VITAMIN D, ERGOCALCIFEROL, PO Take by mouth.     No facility-administered medications prior to visit.    PAST MEDICAL HISTORY: Past Medical History:  Diagnosis Date   Allergy    Gestational diabetes 2005   Vitamin D deficiency 10/07/2016    PAST SURGICAL HISTORY: Past Surgical History:  Procedure Laterality Date   CHOLECYSTECTOMY     OVARIAN CYST REMOVAL  20 years ago    FAMILY HISTORY: Family History  Problem Relation Age of Onset   Diabetes Mother    Mental illness Mother    High blood pressure Father    High Cholesterol Father    Mental illness Brother    Breast cancer Paternal Aunt    Healthy Daughter    Healthy Son    Colon cancer Neg Hx    Esophageal cancer Neg Hx    Rectal cancer Neg Hx    Stomach  cancer Neg Hx     SOCIAL HISTORY: Social History   Socioeconomic History   Marital status: Widowed    Spouse name: Not on file   Number of children: 2   Years of education: Not on file   Highest education level: Not on file  Occupational History   Occupation: Armed forces training and education officer: [employer]  Tobacco Use   Smoking status: Never   Smokeless tobacco: Never  Vaping Use   Vaping Use: Never used  Substance and Sexual Activity   Alcohol use: No   Drug use: No   Sexual activity: Yes    Birth control/protection: Post-menopausal  Other Topics Concern   Not on file  Social History Narrative   Right handed    Caffeine 1-2 cups per week   Social Determinants of Health   Financial Resource Strain: Not on file  Food Insecurity: Not on file  Transportation Needs: Not on file  Physical Activity: Not on file  Stress: Not on file  Social Connections: Not on file  Intimate Partner Violence: Not on file   PHYSICAL EXAM  Vitals:   06/07/21 1044  BP: 116/73  Pulse: (!) 55  Weight: 173 lb 8 oz (78.7 kg)  Height: 5\' 5"  (1.651 m)   Body mass index is 28.87 kg/m.  Generalized: Well developed, in no acute distress   Neurological examination  Mentation: Alert oriented to time, place, history taking. Follows all commands speech and language fluent Cranial nerve II-XII: Pupils were equal round reactive to light. Extraocular movements were full, visual field were full on confrontational test. Facial sensation and strength were normal. Head turning and shoulder shrug  were normal and symmetric. Twitching noted below left eye lid. Motor: The motor testing reveals 5 over 5 strength of all 4 extremities. Good symmetric motor tone is noted throughout.   Coordination: Cerebellar testing reveals good finger-nose-finger and heel-to-shin bilaterally.  Gait and station: Gait is normal. Tandem gait is normal. Romberg is negative. No drift is seen.  Reflexes: Deep tendon reflexes are  symmetric but increased throughout   DIAGNOSTIC DATA (LABS, IMAGING, TESTING) - I reviewed patient records, labs, notes, testing and imaging myself where available.  Lab Results  Component Value Date   WBC 7.2 10/04/2020   HGB 12.9 10/04/2020   HCT 39.1 10/04/2020   MCV 82.2 10/04/2020   PLT 239.0 10/04/2020      Component Value Date/Time   NA 137 10/04/2020 0940   K 4.1 10/04/2020 0940   CL 104 10/04/2020 0940   CO2 27 10/04/2020 0940   GLUCOSE 97 10/04/2020 0940   BUN 13 10/04/2020 0940   CREATININE 0.81 10/04/2020 0940   CALCIUM 8.9 10/04/2020 0940   PROT 7.0 10/04/2020 0940   PROT 7.1 11/05/2018 0927   ALBUMIN 4.0 10/04/2020 0940   ALBUMIN 3.7 11/19/2018 0814   AST 15 10/04/2020 0940   ALT 15 10/04/2020 0940   ALKPHOS 43 10/04/2020 0940   BILITOT 0.6 10/04/2020 0940   GFRNONAA 100 11/30/2019 1050   GFRAA 116 11/30/2019 1050   Lab Results  Component Value Date   CHOL 191 10/04/2020   HDL 57.30 10/04/2020   LDLCALC 134 (H) 10/04/2019   LDLDIRECT 110.0 10/04/2020   TRIG 213.0 (H) 10/04/2020   CHOLHDL 3 10/04/2020   Lab Results  Component Value Date   HGBA1C 6.3 10/04/2020   Lab Results  Component Value Date   VITAMINB12 1,046 11/05/2018   Lab Results  Component Value Date   TSH 1.60 10/04/2020      ASSESSMENT AND PLAN 56 y.o. year old female  Relapsing remitting multiple sclerosis  -Confirmed by abnormal MRI of the brain in July 2020, last MRI of the brain was in February 2022, there was stable,  -CSF showed more than 5 oligoclonal banding  -Mild lesion noted on MRI of the brain, no cervical cord lesion per report, but on examination, she clearly has hyperreflexia, suggestive of the central spinal cord involvement,  Previously patient declined treatment, now is willing to consider immunomodulation therapy,  Repeat MRI of the brain and cervical spine with without contrast  New onset right elbow and wrist pain,  Most consistent with musculoskeletal  etiology, suggested heating pad, Voltaren gel as needed, NSAIDs as needed  Right forehead area paresthesia, rash  -In March 2020, suggestive of V1 shingles, treated with Valtrex and prednisone, has since received shingles vaccine  Flareup of postherpetic neuralgia, helped by gabapentin,  No longer have significant pain,  Chronic migraine headache  -Fioricet as needed, r    Vitamin D deficiency  Is on vitamin D supplement  Marcial Pacas, M.D. Ph.D.  [facility] [place], [place] [address] Phone: [phone] Fax:      [phone]

## 2021-06-07 NOTE — Patient Instructions (Addendum)
Medications   National Multiple Sclerosis Society (nationalmssociety.org)  IV infusions of Ocrevus (ocrelizumab) Tysabri (natalizumab)  Other medications Aubagio (teriflunomide) Bafiertam (monomethyl fumarate) Dimethyl Fumarate (dimethyl fumarate - generic equivalent of Tecfidera) Gilenya (fingolimod)   Lewit subcutaneous injection Kesimpta (ofatumumab)  Voltaren gel

## 2021-06-12 ENCOUNTER — Telehealth: Payer: Self-pay | Admitting: Neurology

## 2021-06-12 NOTE — Telephone Encounter (Signed)
[employer] Josem Kaufmann: [id number] & [phone] (exp. 06/11/21 to 08/10/21).   Left voicemail for patient to call back to schedule.

## 2021-08-13 ENCOUNTER — Ambulatory Visit (INDEPENDENT_AMBULATORY_CARE_PROVIDER_SITE_OTHER): Payer: 59 | Admitting: Neurology

## 2021-08-13 ENCOUNTER — Encounter: Payer: Self-pay | Admitting: Neurology

## 2021-08-13 ENCOUNTER — Telehealth: Payer: Self-pay

## 2021-08-13 VITALS — BP 114/70 | HR 86 | Ht 65.0 in | Wt 173.0 lb

## 2021-08-13 DIAGNOSIS — G35 Multiple sclerosis: Secondary | ICD-10-CM

## 2021-08-13 DIAGNOSIS — E559 Vitamin D deficiency, unspecified: Secondary | ICD-10-CM

## 2021-08-13 DIAGNOSIS — R7309 Other abnormal glucose: Secondary | ICD-10-CM | POA: Diagnosis not present

## 2021-08-13 NOTE — Patient Instructions (Addendum)
Aubagio? (teriflunomide) ? ?Dimethyl Fumarate (dimethyl fumarate - generic equivalent of Tecfidera) ? ?Gilenya? (fingolimod) ?

## 2021-08-13 NOTE — Progress Notes (Addendum)
HISTORY OF PRESENT ILLNESS: Jacqueline Rice is a 56 year old female, seen in request by her primary care physician Dr. Briscoe Deutscher ophthalmologist Dr. Katy Fitch, [name], for evaluation of right facial discomfort, initial evaluation was through virtual visit on October 15, 2018.   I have reviewed and summarized the referring note from the referring physician.  She had a history of chickenpox, but never had a history of shingles, over the past few years, she had intermittent right forehead above right eyelid area discomfort, usually short lasting for few weeks it would go away, never had rash broke out   But this time she had similar sensation since March 2020, been ongoing for 3 months, it is painful above right eyelid, extending to right forehead, sensitive to touch, sometimes sharp radiating pain last 5 to 10 minutes, spare right occipital region, the skin is sensitive to touch,  shampooing her hair would trigger the pain, she complains of dry eyes, was seen by Dr. Carolynn Sayers, will reported normal,   Laboratory evaluation June 2020 showed normal TSH, C-reactive protein, A1c of 6.0, vitamin D of 22, lipid profile showed cholesterol of 203, LDL of 118, normal CMP, CBC   UPDATE July 9th 2020: She did have mild rash at right eye brown in June, now has much improved,  Still has mild dry itching skin, not as sensitive,  She reported a history of migraine headaches, usually clustered around her menstruation peroid of time, right lateralized moderate pounding headache with associated light, noise sensitivity.  She has tried over-the-counter medication with limited help, never tried triptan treatment in the past   Laboratory evaluations in 2020 reviewed, normal TSH, C-reactive protein, CMP, CBC, A1c, vitamin D level was 22   I personally reviewed MRI of the brain with without contrast on October 28, 2018: Multiple round and ovoid periventricular, subcortical, and pericallosal T2 hyperintensity lesion, some of  this are hypointense on T1 views, this raised the possibility of multiple sclerosis    Patient denies a history of strokelike symptoms of sudden visual loss   UPDATE November 24 2018: She had lumbar puncture on November 19, 2018, developed positive lumbar headache, still present, sitting up position for 20 minutes, she would get severe occipital, vertex area headaches, Fioricet provide limited help   CSF showed more than 5 oligoclonal banding, total protein was 24   Personally reviewed MRI of cervical spine C5-6, disc bulging with severe left foraminal stenosis no cord signal changes no evidence of central canal stenosis.  UPDATE Jun 01 2020: I personally reviewed repeat MRI of the brain December 08, 2019, in comparison to previous MRI in 2020, multiple round oval, periventricular, subcortical T2/flair hyperintensity lesion, no contrast-enhancement, no change compared to previous scan  Previous evaluation including CSF showed more than 5 oligoclonal banding, confirmed the diagnosis of relapsing remitting multiple sclerosis, she chooses not to proceed with any treatment then,  Laboratory evaluation August 2021, A1c 5.9, normal TSH 1.24, lipid panel, LDL 134, cholesterol 220, normal CMP, CBC, hemoglobin of 12.8,  She denies any focal symptoms, has flareup of right frontal area postherpetic neuralgia, responding well to gabapentin, she just lost her husband on May 30 2020, still grieving  UPDATE Jun 07 2021: She works from home, desk job, 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, denies bowel or bladder incontinence, she complains of since January 2023, she began to notice right elbow pain, and wrist pain, worried about the possibility of recurrent MS,   We personally reviewed MRI of the brain most recent  in February 2022, multiple stable supratentorium lesions  MRI of cervical spine in July 2020, no spinal cord involvement, but today's examination she is clearly hyperreflexia, bilateral Babinski signs,  Update August 13, 2021, She did not have MRI of the brain and cervical spine as previously scheduled, denies significant focal deficit, but does notice mild unsteady gait, worked a Network engineer job, occasionally stress incontinence, complains of fatigue  We again went over previous MRI of the brain in February 2022, cervical spine in July 2020, she has mild to moderate supratentorium lesion load, hyperreflexia on examination, suggestive of cord involvement,  REVIEW OF SYSTEMS: Out of a complete 14 system review of symptoms, the patient complains only of the following symptoms, and all other reviewed systems are negative.  N/A  ALLERGIES: Allergies  Allergen Reactions   Amoxicillin Nausea And Vomiting   Penicillins Nausea And Vomiting    HOME MEDICATIONS: Outpatient Medications Prior to Visit  Medication Sig Dispense Refill   Ascorbic Acid (VITAMIN C PO) Take by mouth.     butalbital-acetaminophen-caffeine (FIORICET) 50-325-40 MG tablet Take 1 tablet by mouth every 6 (six) hours as needed for headache. 12 tablet 5   cetirizine (ZYRTEC) 10 MG tablet Take 10 mg by mouth daily.     clonazePAM (KLONOPIN) 0.5 MG tablet Take 1 tablet (0.5 mg total) by mouth at bedtime as needed for anxiety. 30 tablet 1   fluticasone (FLONASE) 50 MCG/ACT nasal spray Place into both nostrils daily.     gabapentin (NEURONTIN) 100 MG capsule Take 3 capsules (300 mg total) by mouth 3 (three) times daily. (Patient taking differently: Take 300 mg by mouth 3 (three) times daily as needed.) 270 capsule 6   Multiple Vitamin (MULTIVITAMIN ADULT PO) Take by mouth. OTC     sertraline (ZOLOFT) 50 MG tablet Take 1 tablet (50 mg total) by mouth daily. 90 tablet 3   VITAMIN D, ERGOCALCIFEROL, PO Take by mouth.     No facility-administered medications prior to visit.    PAST MEDICAL HISTORY: Past Medical History:  Diagnosis Date   Allergy    Gestational diabetes 2005   Vitamin D deficiency 10/07/2016    PAST SURGICAL HISTORY: Past Surgical  History:  Procedure Laterality Date   CHOLECYSTECTOMY     OVARIAN CYST REMOVAL  20 years ago    FAMILY HISTORY: Family History  Problem Relation Age of Onset   Diabetes Mother    Mental illness Mother    High blood pressure Father    High Cholesterol Father    Mental illness Brother    Breast cancer Paternal Aunt    Healthy Daughter    Healthy Son    Colon cancer Neg Hx    Esophageal cancer Neg Hx    Rectal cancer Neg Hx    Stomach cancer Neg Hx     SOCIAL HISTORY: Social History   Socioeconomic History   Marital status: Widowed    Spouse name: Not on file   Number of children: 2   Years of education: Not on file   Highest education level: Not on file  Occupational History   Occupation: Armed forces training and education officer: [employer]  Tobacco Use   Smoking status: Never   Smokeless tobacco: Never  Vaping Use   Vaping Use: Never used  Substance and Sexual Activity   Alcohol use: No   Drug use: No   Sexual activity: Yes    Birth control/protection: Post-menopausal  Other Topics Concern   Not on  file  Social History Narrative   Right handed    Caffeine 1-2 cups per week   Social Determinants of Health   Financial Resource Strain: Not on file  Food Insecurity: Not on file  Transportation Needs: Not on file  Physical Activity: Not on file  Stress: Not on file  Social Connections: Not on file  Intimate Partner Violence: Not on file   PHYSICAL EXAM  Vitals:   08/13/21 1119  BP: 114/70  Pulse: 86  Weight: 173 lb (78.5 kg)  Height: '5\' 5"'$  (1.651 m)   Body mass index is 28.79 kg/m.   PHYSICAL EXAMNIATION:  Gen: NAD, conversant, well nourised, well groomed                     Cardiovascular: Regular rate rhythm, no peripheral edema, warm, nontender. Eyes: Conjunctivae clear without exudates or hemorrhage Neck: Supple, no carotid bruits. Pulmonary: Clear to auscultation bilaterally   NEUROLOGICAL EXAM:  MENTAL STATUS: Speech/cognition: Awake, alert,  oriented to history taking and casual conversation   CRANIAL NERVES: CN II: Visual fields are full to confrontation.  Pupils are round equal and briskly reactive to light. CN III, IV, VI: extraocular movement are normal. No ptosis. CN V: Facial sensation is intact to pinprick in all 3 divisions bilaterally. Corneal responses are intact.  CN VII: Face is symmetric with normal eye closure and smile. CN VIII: Hearing is normal to casual conversation CN IX, X: Palate elevates symmetrically. Phonation is normal. CN XI: Head turning and shoulder shrug are intact CN XII: Tongue is midline with normal movements and no atrophy.  MOTOR: There is no pronator drift of out-stretched arms. Muscle bulk and tone are normal. Muscle strength is normal.  REFLEXES: Reflexes are 3 and symmetric at the biceps, triceps, knees, and ankles. Plantar responses are extensor  SENSORY: Intact to light touch, pinprick, positional and vibratory sensation are intact in fingers and toes.  COORDINATION: Rapid alternating movements and fine finger movements are intact. There is no dysmetria on finger-to-nose and heel-knee-shin.    GAIT/STANCE: Able to get up from seated position arm crossed, steady, difficulty standing on 1 leg   DIAGNOSTIC DATA (LABS, IMAGING, TESTING) - I reviewed patient records, labs, notes, testing and imaging myself where available.  Lab Results  Component Value Date   WBC 7.2 10/04/2020   HGB 12.9 10/04/2020   HCT 39.1 10/04/2020   MCV 82.2 10/04/2020   PLT 239.0 10/04/2020      Component Value Date/Time   NA 137 10/04/2020 0940   K 4.1 10/04/2020 0940   CL 104 10/04/2020 0940   CO2 27 10/04/2020 0940   GLUCOSE 97 10/04/2020 0940   BUN 13 10/04/2020 0940   CREATININE 0.81 10/04/2020 0940   CALCIUM 8.9 10/04/2020 0940   PROT 7.0 10/04/2020 0940   PROT 7.1 11/05/2018 0927   ALBUMIN 4.0 10/04/2020 0940   ALBUMIN 3.7 11/19/2018 0814   AST 15 10/04/2020 0940   ALT 15 10/04/2020  0940   ALKPHOS 43 10/04/2020 0940   BILITOT 0.6 10/04/2020 0940   GFRNONAA 100 11/30/2019 1050   GFRAA 116 11/30/2019 1050   Lab Results  Component Value Date   CHOL 191 10/04/2020   HDL 57.30 10/04/2020   LDLCALC 134 (H) 10/04/2019   LDLDIRECT 110.0 10/04/2020   TRIG 213.0 (H) 10/04/2020   CHOLHDL 3 10/04/2020   Lab Results  Component Value Date   HGBA1C 6.3 10/04/2020   Lab Results  Component Value Date  DGLOVFIE33 1,046 11/05/2018   Lab Results  Component Value Date   TSH 1.60 10/04/2020      ASSESSMENT AND PLAN 56 y.o. year old female  Relapsing remitting multiple sclerosis  -Confirmed by abnormal MRI of the brain in July 2020, last MRI of the brain was in February 2022, there was stable,  -CSF showed more than 5 oligoclonal banding  -Mild lesion noted on MRI of the brain, no cervical cord lesion per report, but on examination, she clearly has hyperreflexia, suggestive of the central spinal cord involvement,  Previously patient declined treatment, now is willing to consider immunomodulation therapy,  Repeat MRI of the brain and cervical spine with without contrast  We had extensive discussion over medication choice, decided to proceed with [place]  Laboratory evaluations,  Will also refer her for EKG, ophthalmology evaluation purview to first dose,   Chronic migraine headache  -Fioricet as needed, works well   Vitamin D deficiency  Is on vitamin D supplement   Marcial Pacas, M.D. Ph.D.  [facility] [place], [place] [address] Phone: [phone] Fax:      [phone]  Addendum, Lab, Sep 11 2021: normal CBC, hg 12.6,  LFTs, ALT 35, otherwise normal. VZV >4000,  EKG was normal  Retinal  bilateral OCT.

## 2021-08-13 NOTE — Telephone Encounter (Signed)
Received [employer] start form from Dr. Krista Blue. Needs CBC, LFTs, VZV ab, CYP2C9 genotyping, EKG, and ME screening prior to initiation. ? ?Faxed to [employer]. Received a receipt of confirmation. ? ?

## 2021-08-15 NOTE — Telephone Encounter (Signed)
[employer] pt support program Received an old version of enrollment form. ?Referred to go on [email or web address] to download new version.  ?Check started pack box as well.  ?

## 2021-08-20 NOTE — Telephone Encounter (Addendum)
New version of mayzent start form completed and sent to [employer]. Once we received assessment results and can determine dosage, I will complete the PA.  ?

## 2021-08-27 NOTE — Telephone Encounter (Signed)
I called patient to find out if she has been contacted by [employer] to complete her may[employer] pre assessments. No answer, left a message asking her to call me back. ?

## 2021-08-28 NOTE — Telephone Encounter (Signed)
I called patient. I advised her that Dr. Krista Blue wants to repeat MRIs brain and cervical spine. Pt verbalized understanding. ? ?

## 2021-08-28 NOTE — Telephone Encounter (Signed)
I did order repeat MRI of the brain and cervical spine with without contrast at her most recent visit in April 2023 ?

## 2021-08-28 NOTE — Telephone Encounter (Signed)
I called patient. She has received calls and a packet in the mail from [employer]. She plans on returning their calls today to complete the pre-testing prior to starting mayzent. ? ?She is wondering if Dr. Krista Blue wanted to repeat her MRIs prior to starting mayzent. Patient was under the impression that if she started a DMT then Dr.Yan would check an MRI in about 6 months and wanted to confirm that this is still the plan. ?

## 2021-09-03 NOTE — Telephone Encounter (Signed)
[employer] reports that patient has scheduled her pre-mayzent testing for 09/10/21. ?

## 2021-09-13 ENCOUNTER — Telehealth: Payer: Self-pay | Admitting: Neurology

## 2021-09-13 NOTE — Telephone Encounter (Signed)
90 mins MR brain wo & MR cervical wo Dr. Krista Blue [facility] Josem Kaufmann: [id number] exp. 09/11/21-11/10/21 scheduled at [facility] 09/19/21 at 7:30am

## 2021-09-17 NOTE — Telephone Encounter (Signed)
Received EKG and ME screening from 09/10/21: NSR with no AV conduction abnormality, and no apparent macular edema in right or left eye.  Lab results not yet available for review.  Will continue to follow.

## 2021-09-19 ENCOUNTER — Ambulatory Visit: Payer: 59

## 2021-09-19 DIAGNOSIS — M25521 Pain in right elbow: Secondary | ICD-10-CM

## 2021-09-19 DIAGNOSIS — G35 Multiple sclerosis: Secondary | ICD-10-CM

## 2021-09-19 MED ORDER — GADOBENATE DIMEGLUMINE 529 MG/ML IV SOLN
15.0000 mL | Freq: Once | INTRAVENOUS | Status: AC | PRN
  Administered 2021-09-19: 15 mL via INTRAVENOUS

## 2021-09-25 NOTE — Telephone Encounter (Signed)
Dr. Krista Blue reviewed patient's lab results, ME exam results, and EKG results.  Dr. Krista Blue has cleared patient for [place] therapy.  Patient is cleared to start the standard maintenance dose of 2 mg of Mayzent.  I called patient.  I advised her of this.  I advised her I will contact her insurance to complete authorization for [place].  She may be getting calls from [employer] regarding her [place].  Patient verbalized understanding.  Faxed clearance form for Mayzent to [place].  Received a receipt of confirmation.

## 2021-09-25 NOTE — Telephone Encounter (Signed)
[employer] ([place]) need to know if pt has tried any other medications in the past. Would like a call from the nurse.  Contact info: [phone]  Case no: [id number]

## 2021-09-25 NOTE — Telephone Encounter (Signed)
PA for [employer] kit completed via CMM. Sent to [place]. Key: [id number].  PA for mayzent 29m maintenance dose completed via CMM. Sent to [place] Key: [id number].

## 2021-09-25 NOTE — Telephone Encounter (Signed)
I called [employer]. The PAs for [facility] were denied since patient has not tried and failed at least 3 formulary alternatives. They are faxing the denial letter with appeal instructions shortly.

## 2021-10-02 NOTE — Telephone Encounter (Signed)
Dr. Krista Blue completed and signed the appeal letter for [facility]. Marked it urgent. Faxed to [facility] appeals department. Received a receipt of confirmation.

## 2021-10-05 ENCOUNTER — Encounter: Payer: 59 | Admitting: Family Medicine

## 2021-10-08 NOTE — Telephone Encounter (Signed)
I called patient to discuss the [employer] PAP application for mayzent with patient. No answer, left a message asking her to call me back.

## 2021-10-08 NOTE — Telephone Encounter (Signed)
I called [employer]. The appeal for mayzent was also denied. They are faxing me this determination.

## 2021-10-08 NOTE — Telephone Encounter (Signed)
Patient returned my call. I discussed this with her. She asked me to email her the link to the [employer] PAP application and she will complete her portion.

## 2021-10-09 ENCOUNTER — Telehealth: Payer: Self-pay | Admitting: Neurology

## 2021-10-09 MED ORDER — MAYZENT STARTER PACK 12 X 0.25 MG PO TBPK: ORAL_TABLET | ORAL | 0 refills | Status: DC

## 2021-10-09 MED ORDER — MAYZENT 2 MG PO TABS: 2.0000 mg | ORAL_TABLET | Freq: Every day | ORAL | 3 refills | Status: DC

## 2021-10-09 MED ORDER — MAYZENT 2 MG PO TABS: 2.0000 mg | ORAL_TABLET | Freq: Every day | ORAL | 11 refills | Status: DC

## 2021-10-09 NOTE — Addendum Note (Signed)
Addended by: Lester [place] A on: 10/09/2021 07:37 AM   Modules accepted: Orders

## 2021-10-09 NOTE — Telephone Encounter (Signed)
LVM and sent mychart msg informing pt of r/s needed for 8/17- Judson Roch out.

## 2021-10-09 NOTE — Telephone Encounter (Signed)
Dr. Krista Blue completed her portion on the PAP application for mayzent. Included RX for mayzent starter and maintenance and PA and appeal denial. Faxed to [employer] PAP.  Received a receipt of confirmation.

## 2021-10-15 NOTE — Telephone Encounter (Signed)
I called patient to discuss the status of her PAP application. No answer, left a message asking her to call me back.

## 2021-10-16 NOTE — Telephone Encounter (Signed)
I called patient again to discuss. No answer, left a message asking her to call me back.

## 2021-10-27 ENCOUNTER — Other Ambulatory Visit: Payer: Self-pay | Admitting: Neurology

## 2021-10-29 NOTE — Telephone Encounter (Signed)
Last OV was on 08/13/21.  Next OV is scheduled for 04/08/22.  Last RX was written on 06/25/21 for 30 tabs.    Drug Database has been reviewed. Please e-scribe as work in [place].

## 2021-12-05 ENCOUNTER — Other Ambulatory Visit: Payer: Self-pay | Admitting: Family Medicine

## 2021-12-12 ENCOUNTER — Ambulatory Visit (INDEPENDENT_AMBULATORY_CARE_PROVIDER_SITE_OTHER): Payer: 59 | Admitting: Family Medicine

## 2021-12-12 ENCOUNTER — Encounter: Payer: Self-pay | Admitting: Family Medicine

## 2021-12-12 VITALS — BP 110/60 | HR 92 | Temp 99.1°F | Ht 65.0 in | Wt 175.2 lb

## 2021-12-12 DIAGNOSIS — R7303 Prediabetes: Secondary | ICD-10-CM | POA: Diagnosis not present

## 2021-12-12 DIAGNOSIS — B0229 Other postherpetic nervous system involvement: Secondary | ICD-10-CM | POA: Diagnosis not present

## 2021-12-12 DIAGNOSIS — G35 Multiple sclerosis: Secondary | ICD-10-CM

## 2021-12-12 DIAGNOSIS — Z0000 Encounter for general adult medical examination without abnormal findings: Secondary | ICD-10-CM

## 2021-12-12 DIAGNOSIS — G43709 Chronic migraine without aura, not intractable, without status migrainosus: Secondary | ICD-10-CM | POA: Diagnosis not present

## 2021-12-12 DIAGNOSIS — F4321 Adjustment disorder with depressed mood: Secondary | ICD-10-CM

## 2021-12-12 DIAGNOSIS — Z136 Encounter for screening for cardiovascular disorders: Secondary | ICD-10-CM

## 2021-12-12 LAB — COMPREHENSIVE METABOLIC PANEL
ALT: 34 U/L (ref 0–35)
AST: 22 U/L (ref 0–37)
Albumin: 4.1 g/dL (ref 3.5–5.2)
Alkaline Phosphatase: 44 U/L (ref 39–117)
BUN: 12 mg/dL (ref 6–23)
CO2: 28 mEq/L (ref 19–32)
Calcium: 9.1 mg/dL (ref 8.4–10.5)
Chloride: 101 mEq/L (ref 96–112)
Creatinine, Ser: 0.78 mg/dL (ref 0.40–1.20)
GFR: 85.87 mL/min (ref >60.00)
Glucose, Bld: 213 mg/dL — ABNORMAL HIGH (ref 70–99)
Potassium: 4.2 mEq/L (ref 3.5–5.1)
Sodium: 135 mEq/L (ref 135–145)
Total Bilirubin: 0.3 mg/dL (ref 0.2–1.2)
Total Protein: 6.8 g/dL (ref 6.0–8.3)

## 2021-12-12 LAB — CBC WITH DIFFERENTIAL/PLATELET
Basophils Absolute: 0 10*3/uL (ref 0.0–0.1)
Basophils Relative: 0.3 % (ref 0.0–3.0)
Eosinophils Absolute: 0.2 10*3/uL (ref 0.0–0.7)
Eosinophils Relative: 4.1 % (ref 0.0–5.0)
HCT: 38.1 % (ref 36.0–46.0)
Hemoglobin: 12.6 g/dL (ref 12.0–15.0)
Lymphocytes Relative: 5.5 % — ABNORMAL LOW (ref 12.0–46.0)
Lymphs Abs: 0.3 10*3/uL — ABNORMAL LOW (ref 0.7–4.0)
MCHC: 33.1 g/dL (ref 30.0–36.0)
MCV: 83.4 fl (ref 78.0–100.0)
Monocytes Absolute: 0.5 10*3/uL (ref 0.1–1.0)
Monocytes Relative: 10 % (ref 3.0–12.0)
Neutro Abs: 4.1 10*3/uL (ref 1.4–7.7)
Neutrophils Relative %: 80.1 % — ABNORMAL HIGH (ref 43.0–77.0)
Platelets: 271 10*3/uL (ref 150.0–400.0)
RBC: 4.57 Mil/uL (ref 3.87–5.11)
RDW: 14.1 % (ref 11.5–15.5)
WBC: 5.1 10*3/uL (ref 4.0–10.5)

## 2021-12-12 LAB — HEMOGLOBIN A1C: Hgb A1c MFr Bld: 7.1 % — ABNORMAL HIGH (ref 4.6–6.5)

## 2021-12-12 LAB — LIPID PANEL
Cholesterol: 278 mg/dL — ABNORMAL HIGH (ref 0–200)
HDL: 52.3 mg/dL (ref >39.00)
NonHDL: 226.15
Total CHOL/HDL Ratio: 5
Triglycerides: 341 mg/dL — ABNORMAL HIGH (ref 0.0–149.0)
VLDL: 68.2 mg/dL — ABNORMAL HIGH (ref 0.0–40.0)

## 2021-12-12 LAB — LDL CHOLESTEROL, DIRECT: Direct LDL: 183 mg/dL

## 2021-12-12 MED ORDER — GABAPENTIN 100 MG PO CAPS: 300.0000 mg | ORAL_CAPSULE | Freq: Three times a day (TID) | ORAL | Status: DC | PRN

## 2021-12-12 NOTE — Patient Instructions (Addendum)

## 2021-12-12 NOTE — Progress Notes (Signed)
Subjective  Chief Complaint  Patient presents with   Annual Exam    Pt here for Annual exam and is not currently fasting     HPI: Jacqueline Rice is a 56 y.o. female who presents to [facility] [place] today for a Female Wellness Visit. She also has the concerns and/or needs as listed above in the chief complaint. These will be addressed in addition to the Health Maintenance Visit.   Wellness Visit: annual visit with health maintenance review and exam without Pap  HM: screens are current. Imms are current Chronic disease f/u and/or acute problem visit: (deemed necessary to be done in addition to the wellness visit): Grief: widowed last year: doing much better over all. Remains on zoloft which continues to help with sadness/ no Aes. Uses klonopin still for sleep. 1/4 tab nightly. Reviewed neuro notes; now on meds for MS; rare gabapentin need for postherpetic neuralgia and on migrain meds as needed. Reports all neuro problems are stable.  H/o prediabetes w/o sxs of hyperglycemia   Assessment  1. Annual physical exam   2. Relapsing remitting multiple sclerosis ([place])   3. Chronic migraine w/o aura w/o status migrainosus, not intractable   4. Postherpetic neuralgia   5. Prediabetes   6. Complicated grief      Plan  Female Wellness Visit: Age appropriate Health Maintenance and Prevention measures were discussed with patient. Included topics are cancer screening recommendations, ways to keep healthy (see AVS) including dietary and exercise recommendations, regular eye and dental care, use of seat belts, and avoidance of moderate alcohol use and tobacco use. Pt to schedule mammo this year BMI: discussed patient's BMI and encouraged positive lifestyle modifications to help get to or maintain a target BMI. HM needs and immunizations were addressed and ordered. See below for orders. See HM and immunization section for updates. Routine labs and screening tests ordered  including cmp, cbc and lipids where appropriate. Discussed recommendations regarding Vit D and calcium supplementation (see AVS)  Chronic disease management visit and/or acute problem visit: MS/migraines and neuralgia are stable on meds. Will follow with neuro Complicated grief and benzo dependence: discussed continuation of sertraline 50 daily which is controlling grief sxs well. Discussed weaning slowly off of klonopin. She will f/u with me if has difficulty with sleep. Discussed dependence and withdrawal Prediabetes; monitor glucose and hgba1c  Follow up: 12 mo for cpe and recheck  No orders of the defined types were placed in this encounter.  No orders of the defined types were placed in this encounter.     Body mass index is 29.15 kg/m. Wt Readings from Last 3 Encounters:  12/12/21 175 lb 3.2 oz (79.5 kg)  08/13/21 173 lb (78.5 kg)  06/07/21 173 lb 8 oz (78.7 kg)     Patient Active Problem List   Diagnosis Date Noted   Relapsing remitting multiple sclerosis ([place]) 06/07/2021    Priority: High   Widowed 06/2020 11/16/2020    Priority: High   Chronic migraine w/o aura w/o status migrainosus, not intractable 06/01/2020    Priority: High   Postherpetic neuralgia 06/01/2020    Priority: High   Prediabetes 10/02/2018    Priority: High    a1c 6.29 September 2020     Vitamin D deficiency 10/07/2016    Priority: [place] Maintenance  Topic Date Due   INFLUENZA VACCINE  11/27/2021   COVID-19 Vaccine (5 - Pfizer series) 12/28/2021 (Originally 10/14/2020)   MAMMOGRAM  04/18/2022   PAP SMEAR-Modifier  12/11/2025   TETANUS/TDAP  10/01/2027   COLONOSCOPY (Pts 45-50yr Insurance coverage will need to be confirmed)  02/24/2028   Hepatitis C Screening  Completed   HIV Screening  Completed   Zoster Vaccines- Shingrix  Completed   HPV VACCINES  Aged Out   Immunization History  Administered Date(s) Administered   Influenza Inj Mdck Quad Pf 02/14/2018   Influenza,inj,Quad PF,6+  Mos 02/02/2017, 02/04/2019, 02/24/2020, 02/15/2021   PFIZER Comirnaty(Gray Top)Covid-19 Tri-Sucrose Vaccine 08/19/2020   PFIZER(Purple Top)SARS-COV-2 Vaccination 07/04/2019, 07/25/2019, 03/11/2020   Tdap 09/30/2017   Zoster Recombinat (Shingrix) 08/17/2019, 10/27/2019   We updated and reviewed the patient's past history in detail and it is documented below. Allergies: Patient is allergic to amoxicillin and penicillins. Past Medical History Patient  has a past medical history of Allergy, Gestational diabetes (2005), and Vitamin D deficiency (10/07/2016). Past Surgical History Patient  has a past surgical history that includes Ovarian cyst removal (20 years ago) and Cholecystectomy. Family History: Patient family history includes Breast cancer in her paternal aunt; Diabetes in her mother; Healthy in her daughter and son; High Cholesterol in her father; High blood pressure in her father; Mental illness in her brother and mother. Social History:  Patient  reports that she has never smoked. She has never used smokeless tobacco. She reports that she does not drink alcohol and does not use drugs.  Review of Systems: Constitutional: negative for fever or malaise Ophthalmic: negative for photophobia, double vision or loss of vision Cardiovascular: negative for chest pain, dyspnea on exertion, or new LE swelling Respiratory: negative for SOB or persistent cough Gastrointestinal: negative for abdominal pain, change in bowel habits or melena Genitourinary: negative for dysuria or gross hematuria, no abnormal uterine bleeding or disharge Musculoskeletal: negative for new gait disturbance or muscular weakness Integumentary: negative for new or persistent rashes, no breast lumps Neurological: negative for TIA or stroke symptoms Psychiatric: negative for SI or delusions Allergic/Immunologic: negative for hives  Patient Care Team    Relationship Specialty Notifications Start End  ALeamon Arnt MD  PCP - General Family Medicine  10/04/19   GDebbra Riding MD Consulting Physician Ophthalmology  10/12/18   YMarcial Pacas MD Consulting Physician Neurology  10/04/19     Objective  Vitals: BP 110/60   Pulse 92   Temp 99.1 F (37.3 C)   Ht '5\' 5"'$  (1.651 m)   Wt 175 lb 3.2 oz (79.5 kg)   SpO2 95%   BMI 29.15 kg/m  General:  Well developed, well nourished, no acute distress  Psych:  Alert and orientedx3,normal mood and affect HEENT:  Normocephalic, atraumatic, non-icteric sclera,  supple neck without adenopathy, mass or thyromegaly Cardiovascular:  Normal S1, S2, RRR without gallop, rub or murmur Respiratory:  Good breath sounds bilaterally, CTAB with normal respiratory effort Gastrointestinal: normal bowel sounds, soft, non-tender, no noted masses. No HSM MSK: no deformities, contusions. Joints are without erythema or swelling.  Skin:  Warm, no rashes or suspicious lesions noted    Commons side effects, risks, benefits, and alternatives for medications and treatment plan prescribed today were discussed, and the patient expressed understanding of the given instructions. Patient is instructed to call or message via MyChart if he/she has any questions or concerns regarding our treatment plan. No barriers to understanding were identified. We discussed Red Flag symptoms and signs in detail. Patient expressed understanding regarding what to do in case of urgent or emergency type symptoms.  Medication list was reconciled,  printed and provided to the patient in AVS. Patient instructions and summary information was reviewed with the patient as documented in the AVS. This note was prepared with assistance of Dragon voice recognition software. Occasional wrong-word or sound-a-like substitutions may have occurred due to the inherent limitations of voice recognition software  This visit occurred during the SARS-CoV-2 public health emergency.  Safety protocols were in place, including screening questions  prior to the visit, additional usage of staff PPE, and extensive cleaning of exam room while observing appropriate contact time as indicated for disinfecting solutions.

## 2021-12-13 ENCOUNTER — Ambulatory Visit: Payer: 59 | Admitting: Neurology

## 2021-12-23 NOTE — Progress Notes (Signed)
Please call patient: I have reviewed his/her lab results. Please see the mychart note from last week: pt has not yet reviewed results. Please call her and ask her to schedule a visit due to labs showing she now has diabetes and high cholesterol. I'd like a visit to discuss with her.

## 2022-01-21 ENCOUNTER — Encounter: Payer: Self-pay | Admitting: *Deleted

## 2022-04-08 ENCOUNTER — Encounter: Payer: Self-pay | Admitting: Neurology

## 2022-04-08 ENCOUNTER — Ambulatory Visit: Payer: 59 | Admitting: Neurology

## 2022-04-11 ENCOUNTER — Encounter: Payer: Self-pay | Admitting: *Deleted

## 2022-04-15 ENCOUNTER — Other Ambulatory Visit: Payer: Self-pay | Admitting: Family Medicine

## 2022-04-15 DIAGNOSIS — Z1231 Encounter for screening mammogram for malignant neoplasm of breast: Secondary | ICD-10-CM

## 2022-05-01 ENCOUNTER — Ambulatory Visit (INDEPENDENT_AMBULATORY_CARE_PROVIDER_SITE_OTHER): Payer: 59 | Admitting: Family Medicine

## 2022-05-01 ENCOUNTER — Encounter: Payer: Self-pay | Admitting: Family Medicine

## 2022-05-01 VITALS — BP 100/64 | HR 79 | Temp 98.0°F | Ht 62.0 in | Wt 179.4 lb

## 2022-05-01 DIAGNOSIS — E1169 Type 2 diabetes mellitus with other specified complication: Secondary | ICD-10-CM | POA: Diagnosis not present

## 2022-05-01 DIAGNOSIS — E119 Type 2 diabetes mellitus without complications: Secondary | ICD-10-CM | POA: Insufficient documentation

## 2022-05-01 DIAGNOSIS — E782 Mixed hyperlipidemia: Secondary | ICD-10-CM | POA: Diagnosis not present

## 2022-05-01 DIAGNOSIS — E1165 Type 2 diabetes mellitus with hyperglycemia: Secondary | ICD-10-CM | POA: Insufficient documentation

## 2022-05-01 DIAGNOSIS — E118 Type 2 diabetes mellitus with unspecified complications: Secondary | ICD-10-CM

## 2022-05-01 LAB — POCT GLYCOSYLATED HEMOGLOBIN (HGB A1C): Hemoglobin A1C: 7.6 % — AB (ref 4.0–5.6)

## 2022-05-01 MED ORDER — METFORMIN HCL ER 750 MG PO TB24: 750.0000 mg | ORAL_TABLET | Freq: Every day | ORAL | 3 refills | Status: DC

## 2022-05-01 MED ORDER — ROSUVASTATIN CALCIUM 10 MG PO TABS: 10.0000 mg | ORAL_TABLET | Freq: Every day | ORAL | 3 refills | Status: DC

## 2022-05-01 NOTE — Patient Instructions (Addendum)
Please return in 3 months for diabetes follow up   If you have any questions or concerns, please don't hesitate to send me a message via MyChart or call the office at [phone]. Thank you for visiting with [place] today! It's our pleasure caring for you.   Diabetes Mellitus and Nutrition, Adult When you have diabetes, or diabetes mellitus, it is very important to have healthy eating habits because your blood sugar (glucose) levels are greatly affected by what you eat and drink. Eating healthy foods in the right amounts, at about the same times every day, can help you: Manage your blood glucose. Lower your risk of heart disease. Improve your blood pressure. Reach or maintain a healthy weight. What can affect my meal plan? Every person with diabetes is different, and each person has different needs for a meal plan. Your health care provider may recommend that you work with a dietitian to make a meal plan that is best for you. Your meal plan may vary depending on factors such as: The calories you need. The medicines you take. Your weight. Your blood glucose, blood pressure, and cholesterol levels. Your activity level. Other health conditions you have, such as heart or kidney disease. How do carbohydrates affect me? Carbohydrates, also called carbs, affect your blood glucose level more than any other type of food. Eating carbs raises the amount of glucose in your blood. It is important to know how many carbs you can safely have in each meal. This is different for every person. Your dietitian can help you calculate how many carbs you should have at each meal and for each snack. How does alcohol affect me? Alcohol can cause a decrease in blood glucose (hypoglycemia), especially if you use insulin or take certain diabetes medicines by mouth. Hypoglycemia can be a life-threatening condition. Symptoms of hypoglycemia, such as sleepiness, dizziness, and confusion, are similar to symptoms of having too much  alcohol. Do not drink alcohol if: Your health care provider tells you not to drink. You are pregnant, may be pregnant, or are planning to become pregnant. If you drink alcohol: Limit how much you have to: 0-1 drink a day for women. 0-2 drinks a day for men. Know how much alcohol is in your drink. In the [place] one drink equals one 12 oz bottle of beer (355 mL), one 5 oz glass of wine (148 mL), or one 1 oz glass of hard liquor (44 mL). Keep yourself hydrated with water, diet soda, or unsweetened iced tea. Keep in mind that regular soda, juice, and other mixers may contain a lot of sugar and must be counted as carbs. What are tips for following this plan?  Reading food labels Start by checking the serving size on the Nutrition Facts label of packaged foods and drinks. The number of calories and the amount of carbs, fats, and other nutrients listed on the label are based on one serving of the item. Many items contain more than one serving per package. Check the total grams (g) of carbs in one serving. Check the number of grams of saturated fats and trans fats in one serving. Choose foods that have a low amount or none of these fats. Check the number of milligrams (mg) of salt (sodium) in one serving. Most people should limit total sodium intake to less than 2,300 mg per day. Always check the nutrition information of foods labeled as "low-fat" or "nonfat." These foods may be higher in added sugar or refined carbs and should be  avoided. Talk to your dietitian to identify your daily goals for nutrients listed on the label. Shopping Avoid buying canned, pre-made, or processed foods. These foods tend to be high in fat, sodium, and added sugar. Shop around the outside edge of the grocery store. This is where you will most often find fresh fruits and vegetables, bulk grains, fresh meats, and fresh dairy products. Cooking Use low-heat cooking methods, such as baking, instead of high-heat cooking methods,  such as deep frying. Cook using healthy oils, such as olive, canola, or sunflower oil. Avoid cooking with butter, cream, or high-fat meats. Meal planning Eat meals and snacks regularly, preferably at the same times every day. Avoid going long periods of time without eating. Eat foods that are high in fiber, such as fresh fruits, vegetables, beans, and whole grains. Eat 4-6 oz (112-168 g) of lean protein each day, such as lean meat, chicken, fish, eggs, or tofu. One ounce (oz) (28 g) of lean protein is equal to: 1 oz (28 g) of meat, chicken, or fish. 1 egg.  cup (62 g) of tofu. Eat some foods each day that contain healthy fats, such as avocado, nuts, seeds, and fish. What foods should I eat? Fruits Berries. Apples. Oranges. Peaches. Apricots. Plums. Grapes. Mangoes. Papayas. Pomegranates. Kiwi. Cherries. Vegetables Leafy greens, including lettuce, spinach, kale, chard, collard greens, mustard greens, and cabbage. Beets. Cauliflower. Broccoli. Carrots. Green beans. Tomatoes. Peppers. Onions. Cucumbers. Brussels sprouts. Grains Whole grains, such as whole-wheat or whole-grain bread, crackers, tortillas, cereal, and pasta. Unsweetened oatmeal. Quinoa. Brown or wild rice. Meats and other proteins Seafood. Poultry without skin. Lean cuts of poultry and beef. Tofu. Nuts. Seeds. Dairy Low-fat or fat-free dairy products such as milk, yogurt, and cheese. The items listed above may not be a complete list of foods and beverages you can eat and drink. Contact a dietitian for more information. What foods should I avoid? Fruits Fruits canned with syrup. Vegetables Canned vegetables. Frozen vegetables with butter or cream sauce. Grains Refined white flour and flour products such as bread, pasta, snack foods, and cereals. Avoid all processed foods. Meats and other proteins Fatty cuts of meat. Poultry with skin. Breaded or fried meats. Processed meat. Avoid saturated fats. Dairy Full-fat yogurt,  cheese, or milk. Beverages Sweetened drinks, such as soda or iced tea. The items listed above may not be a complete list of foods and beverages you should avoid. Contact a dietitian for more information. Questions to ask a health care provider Do I need to meet with a certified diabetes care and education specialist? Do I need to meet with a dietitian? What number can I call if I have questions? When are the best times to check my blood glucose? Where to find more information: American Diabetes Association: diabetes.org Academy of Nutrition and Dietetics: eatright.[employer] of Diabetes and Digestive and Kidney Diseases: AmenCredit.is Association of Diabetes Care & Education Specialists: diabeteseducator.org Summary It is important to have healthy eating habits because your blood sugar (glucose) levels are greatly affected by what you eat and drink. It is important to use alcohol carefully. A healthy meal plan will help you manage your blood glucose and lower your risk of heart disease. Your health care provider may recommend that you work with a dietitian to make a meal plan that is best for you. This information is not intended to replace advice given to you by your health care provider. Make sure you discuss any questions you have with your health care provider.  Document Revised: 11/17/2019 Document Reviewed: 11/17/2019 Elsevier Patient Education  [place]. Diabetes Mellitus and Standards of Medical Care Living with and managing diabetes (diabetes mellitus) can be complicated. Your diabetes treatment may be managed by a team of health care providers, including: A physician who specializes in diabetes (endocrinologist). You might also have visits with a nurse practitioner or physician assistant. Nurses. A registered dietitian. A certified diabetes care and education specialist. An exercise specialist. A pharmacist. An eye doctor. A foot specialist  (podiatrist). A dental care provider. A primary care provider. A mental health care provider. How to manage your diabetes You can do many things to successfully manage your diabetes. Your health care providers will follow guidelines to help you get the best quality of care. Here are general guidelines for your diabetes management plan. Your health care providers may give you more specific instructions. Physical exams When you are diagnosed with diabetes, and each year after that, your health care provider will ask about your medical and family history. You will have a physical exam, which may include: Measuring your height, weight, and body mass index (BMI). Checking your blood pressure. This will be done at every routine medical visit. Your target blood pressure may vary depending on your medical conditions, your age, and other factors. A thyroid exam. A skin exam. Screening for nerve damage (peripheral neuropathy). This may include checking the pulse in your legs and feet and the level of sensation in your hands and feet. A foot exam to inspect the structure and skin of your feet, including checking for cuts, bruises, redness, blisters, sores, or other problems. Screening for blood vessel (vascular) problems. This may include checking the pulse in your legs and feet and checking your temperature. Blood tests Depending on your treatment plan and your personal needs, you may have the following tests: Hemoglobin A1C (HbA1C). This test provides information about blood sugar (glucose) control over the previous 2-3 months. It is used to adjust your treatment plan, if needed. This test will be done: At least 2 times a year, if you are meeting your treatment goals. 4 times a year, if you are not meeting your treatment goals or if your goals have changed. Lipid testing, including total cholesterol, LDL and HDL cholesterol, and triglyceride levels. The goal for LDL is less than 100 mg/dL (5.5 mmol/L).  If you are at high risk for complications, the goal is less than 70 mg/dL (3.9 mmol/L). The goal for HDL is 40 mg/dL (2.2 mmol/L) or higher for men, and 50 mg/dL (2.8 mmol/L) or higher for women. An HDL cholesterol of 60 mg/dL (3.3 mmol/L) or higher gives some protection against heart disease. The goal for triglycerides is less than 150 mg/dL (8.3 mmol/L). Liver function tests. Kidney function tests. Thyroid function tests.  Dental and eye exams  Visit your dentist two times a year. If you have type 1 diabetes, your health care provider may recommend an eye exam within 5 years after you are diagnosed, and then once a year after your first exam. For children with type 1 diabetes, the health care provider may recommend an eye exam when your child is age 17 or older and has had diabetes for 3-5 years. After the first exam, your child should get an eye exam once a year. If you have type 2 diabetes, your health care provider may recommend an eye exam as soon as you are diagnosed, and then every 1-2 years after your first exam. Immunizations A yearly flu (  influenza) vaccine is recommended annually for everyone 6 months or older. This is especially important if you have diabetes. The pneumonia (pneumococcal) vaccine is recommended for everyone 2 years or older who has diabetes. If you are age 51 or older, you may get the pneumonia vaccine as a series of two separate shots. The hepatitis B vaccine is recommended for adults shortly after being diagnosed with diabetes. Adults and children with diabetes should receive all other vaccines according to age-specific recommendations from the Centers for Disease Control and Prevention (CDC). Mental and emotional health Screening for symptoms of eating disorders, anxiety, and depression is recommended at the time of diagnosis and after as needed. If your screening shows that you have symptoms, you may need more evaluation. You may work with a mental health care  provider. Follow these instructions at home: Treatment plan You will monitor your blood glucose levels and may give yourself insulin. Your treatment plan will be reviewed at every medical visit. You and your health care provider will discuss: How you are taking your medicines, including insulin. Any side effects you have. Your blood glucose level target goals. How often you monitor your blood glucose level. Lifestyle habits, such as activity level and tobacco, alcohol, and substance use. Education Your health care provider will assess how well you are monitoring your blood glucose levels and whether you are taking your insulin and medicines correctly. He or she may refer you to: A certified diabetes care and education specialist to manage your diabetes throughout your life, starting at diagnosis. A registered dietitian who can create and review your personal nutrition plan. An exercise specialist who can discuss your activity level and exercise plan. General instructions Take over-the-counter and prescription medicines only as told by your health care provider. Keep all follow-up visits. This is important. Where to find support There are many diabetes support networks, including: American Diabetes Association (ADA): diabetes.org Defeat Diabetes Foundation: defeatdiabetes.org Where to find more information American Diabetes Association (ADA): www.diabetes.org Association of Diabetes Care & Education Specialists (ADCES): diabeteseducator.org International Diabetes Federation (IDF): https://www.munoz-bell.org/ Summary Managing diabetes (diabetes mellitus) can be complicated. Your diabetes treatment may be managed by a team of health care providers. Your health care providers follow guidelines to help you get the best quality care. You should have physical exams, blood tests, blood pressure monitoring, immunizations, and screening tests regularly. Stay updated on how to manage your diabetes. Your health care  providers may also give you more specific instructions based on your individual health. This information is not intended to replace advice given to you by your health care provider. Make sure you discuss any questions you have with your health care provider. Document Revised: 10/21/2019 Document Reviewed: 10/21/2019 Elsevier Patient Education  [place].

## 2022-05-01 NOTE — Progress Notes (Signed)
Subjective  CC:  Chief Complaint  Patient presents with   Diabetes    HPI: Jacqueline Rice is a 57 y.o. female who presents to the office today for follow up of diabetes and problems listed above in the chief complaint.  Diabetes new onset: dxd in August, here for 1st dm visit: reports feels fine. No sxs of hyperglycemia; diet is rich in chocolate and carbs. Some sweetened beverages (sugar in coffee daily, rare sweet tea). Weight is trending up. She sees eye doc annually and last visit in September was normal (due to [place] and meds). No foot sxs.  HLD: some fatty foods.  Normotensive Needs: prevnar vaccine. Foot exam. Urine nephropathy screen  Wt Readings from Last 3 Encounters:  05/01/22 179 lb 6.4 oz (81.4 kg)  12/12/21 175 lb 3.2 oz (79.5 kg)  08/13/21 173 lb (78.5 kg)    BP Readings from Last 3 Encounters:  05/01/22 100/64  12/12/21 110/60  08/13/21 114/70    Assessment  1. Controlled type 2 diabetes mellitus with complication, without long-term current use of insulin ([place])   2. Combined hyperlipidemia associated with type 2 diabetes mellitus ([place])      Plan  Diabetes : education started.  Pt defers formal education nutrition consult at this time. Will improve diet. Start metformin xr '750mg'$  daily. Eye exam current. Will update vaccination next visit. Will check urine next visit HLD: start crestor 10. Discussed diet changes.  I spent a total of 32 minutes for this patient encounter. Time spent included preparation, face-to-face counseling with the patient and coordination of care, review of chart and records, and documentation of the encounter.  Follow up: 3 mo for recheck lipids and dm. Orders Placed This Encounter  Procedures   POCT HgB A1C   Meds ordered this encounter  Medications   metFORMIN (GLUCOPHAGE-XR) 750 MG 24 hr tablet    Sig: Take 1 tablet (750 mg total) by mouth daily with breakfast.    Dispense:  90 tablet    Refill:  3   rosuvastatin (CRESTOR) 10 MG  tablet    Sig: Take 1 tablet (10 mg total) by mouth daily.    Dispense:  90 tablet    Refill:  3      Immunization History  Administered Date(s) Administered   Influenza Inj Mdck Quad Pf 02/14/2018   Influenza,inj,Quad PF,6+ Mos 02/02/2017, 02/04/2019, 02/24/2020, 02/15/2021, 03/13/2022   PFIZER Comirnaty(Gray Top)Covid-19 Tri-Sucrose Vaccine 08/19/2020   PFIZER(Purple Top)SARS-COV-2 Vaccination 07/04/2019, 07/25/2019, 03/11/2020   Tdap 09/30/2017   Zoster Recombinat (Shingrix) 08/17/2019, 10/27/2019    Diabetes Related Lab Review: Lab Results  Component Value Date   HGBA1C 7.6 (A) 05/01/2022   HGBA1C 7.1 (H) 12/12/2021   HGBA1C 6.3 10/04/2020    No results found for: "MICROALBUR", "MALB24HUR" Lab Results  Component Value Date   CREATININE 0.78 12/12/2021   BUN 12 12/12/2021   NA 135 12/12/2021   K 4.2 12/12/2021   CL 101 12/12/2021   CO2 28 12/12/2021   Lab Results  Component Value Date   CHOL 278 (H) 12/12/2021   CHOL 191 10/04/2020   CHOL 220 (H) 10/04/2019   Lab Results  Component Value Date   HDL 52.30 12/12/2021   HDL 57.30 10/04/2020   HDL 56.70 10/04/2019   Lab Results  Component Value Date   LDLCALC 134 (H) 10/04/2019   LDLCALC 118 (H) 10/02/2018   LDLCALC 136 (H) 09/30/2017   Lab Results  Component Value Date   TRIG 341.0 (  H) 12/12/2021   TRIG 213.0 (H) 10/04/2020   TRIG 147.0 10/04/2019   Lab Results  Component Value Date   CHOLHDL 5 12/12/2021   CHOLHDL 3 10/04/2020   CHOLHDL 4 10/04/2019   Lab Results  Component Value Date   LDLDIRECT 183.0 12/12/2021   LDLDIRECT 110.0 10/04/2020   The 10-year ASCVD risk score (Arnett DK, et al., 2019) is: 3.5%   Values used to calculate the score:     Age: 19 years     Sex: Female     Is Non-Hispanic African American: No     Diabetic: Yes     Tobacco smoker: No     Systolic Blood Pressure: 628 mmHg     Is BP treated: No     HDL Cholesterol: 52.3 mg/dL     Total Cholesterol: 278 mg/dL I have  reviewed the PMH, Fam and Soc history. Patient Active Problem List   Diagnosis Date Noted   Controlled type 2 diabetes mellitus with complication, without long-term current use of insulin ([place]) 05/01/2022    Priority: High    A1c 6.29 September 2020, 7.27 November 2021, 7.04 May 2022: 1st visit for diabetes.     Combined hyperlipidemia associated with type 2 diabetes mellitus ([place]) 05/01/2022    Priority: High   Relapsing remitting multiple sclerosis ([place]) 06/07/2021    Priority: High   Widowed 06/2020 11/16/2020    Priority: High   Chronic migraine w/o aura w/o status migrainosus, not intractable 06/01/2020    Priority: High   Postherpetic neuralgia 06/01/2020    Priority: High   Vitamin D deficiency 10/07/2016    Priority: Low    Social History: Patient  reports that she has never smoked. She has never used smokeless tobacco. She reports that she does not drink alcohol and does not use drugs.  Review of Systems: Ophthalmic: negative for eye pain, loss of vision or double vision Cardiovascular: negative for chest pain Respiratory: negative for SOB or persistent cough Gastrointestinal: negative for abdominal pain Genitourinary: negative for dysuria or gross hematuria MSK: negative for foot lesions Neurologic: negative for weakness or gait disturbance  Objective  Vitals: BP 100/64   Pulse 79   Temp 98 F (36.7 C)   Ht '5\' 2"'$  (1.575 m)   Wt 179 lb 6.4 oz (81.4 kg)   SpO2 95%   BMI 32.81 kg/m  General: well appearing, no acute distress  Psych:  Alert and oriented, normal mood and affect  Diabetic education: ongoing education regarding chronic disease management for diabetes was given today. We continue to reinforce the ABC's of diabetic management: A1c (<7 or 8 dependent upon patient), tight blood pressure control, and cholesterol management with goal LDL < 100 minimally. We discuss diet strategies, exercise recommendations, medication options and possible side effects. At each visit,  we review recommended immunizations and preventive care recommendations for diabetics and stress that good diabetic control can prevent other problems. See below for this patient's data.   Commons side effects, risks, benefits, and alternatives for medications and treatment plan prescribed today were discussed, and the patient expressed understanding of the given instructions. Patient is instructed to call or message via MyChart if he/she has any questions or concerns regarding our treatment plan. No barriers to understanding were identified. We discussed Red Flag symptoms and signs in detail. Patient expressed understanding regarding what to do in case of urgent or emergency type symptoms.  Medication list was reconciled, printed and provided to the patient in AVS. Patient  instructions and summary information was reviewed with the patient as documented in the AVS. This note was prepared with assistance of Dragon voice recognition software. Occasional wrong-word or sound-a-like substitutions may have occurred due to the inherent limitations of voice recognition software

## 2022-05-13 ENCOUNTER — Telehealth: Payer: Self-pay

## 2022-05-13 NOTE — Telephone Encounter (Signed)
PA for Mayzent sent via cover my meds.   (Key: [id number])  Your information has been sent to [facility].

## 2022-06-05 ENCOUNTER — Ambulatory Visit
Admission: RE | Admit: 2022-06-05 | Discharge: 2022-06-05 | Disposition: A | Discharge status: Home / Self Care | Payer: 59 | Source: Ambulatory Visit

## 2022-06-05 DIAGNOSIS — Z1231 Encounter for screening mammogram for malignant neoplasm of breast: Secondary | ICD-10-CM

## 2022-08-02 ENCOUNTER — Encounter: Payer: 59 | Admitting: Family Medicine

## 2022-08-07 NOTE — Progress Notes (Signed)
This encounter was created in error - please disregard.

## 2022-08-23 ENCOUNTER — Encounter: Payer: Self-pay | Admitting: Neurology

## 2022-08-27 ENCOUNTER — Ambulatory Visit: Payer: 59 | Admitting: Neurology

## 2022-11-01 ENCOUNTER — Telehealth: Payer: Self-pay

## 2022-11-01 NOTE — Telephone Encounter (Signed)
*  Primary  PA request received for Rosuvastatin Calcium 10MG  tablets  PA not submitted at this time, pending response from providers office of previous trial/failure of Atorvastatin  Key: [id number]

## 2022-11-01 NOTE — Telephone Encounter (Signed)
Hi after looking through pt medication history it does not look like pt has tried Atorvastatin.

## 2022-11-01 NOTE — Telephone Encounter (Signed)
Submitted with this information and is pending determination

## 2022-11-12 NOTE — Telephone Encounter (Signed)
Pharmacy Patient Advocate Encounter  Received notification from  [employer]  that Prior Authorization for ROSUVASTATIN has been DENIED because PATIENT HAS NOT TRIED AND FAILED ATORVASTATIN 40 MG TO 80 MG DAILY.Marland Kitchen  PA #/Case ID/Reference #: [id number]  Please be advised we currently do not have a Pharmacist to review denials, therefore you will need to process appeals accordingly as needed. Thanks for your support at this time. Contact for appeals are as follows: Phone: [phone], Fax: [phone]  Denial letter attached to charts

## 2022-11-27 ENCOUNTER — Encounter (INDEPENDENT_AMBULATORY_CARE_PROVIDER_SITE_OTHER): Payer: Self-pay

## 2022-12-11 LAB — HM DIABETES EYE EXAM

## 2022-12-13 ENCOUNTER — Encounter: Payer: Self-pay | Admitting: Family Medicine

## 2022-12-13 ENCOUNTER — Ambulatory Visit (INDEPENDENT_AMBULATORY_CARE_PROVIDER_SITE_OTHER): Payer: 59 | Admitting: Family Medicine

## 2022-12-13 VITALS — BP 120/72 | HR 77 | Temp 98.0°F | Ht 62.0 in | Wt 169.2 lb

## 2022-12-13 DIAGNOSIS — G43009 Migraine without aura, not intractable, without status migrainosus: Secondary | ICD-10-CM

## 2022-12-13 DIAGNOSIS — Z0001 Encounter for general adult medical examination with abnormal findings: Secondary | ICD-10-CM | POA: Diagnosis not present

## 2022-12-13 DIAGNOSIS — Z6830 Body mass index (BMI) 30.0-30.9, adult: Secondary | ICD-10-CM

## 2022-12-13 DIAGNOSIS — E782 Mixed hyperlipidemia: Secondary | ICD-10-CM

## 2022-12-13 DIAGNOSIS — Z0000 Encounter for general adult medical examination without abnormal findings: Secondary | ICD-10-CM

## 2022-12-13 DIAGNOSIS — Z7984 Long term (current) use of oral hypoglycemic drugs: Secondary | ICD-10-CM

## 2022-12-13 DIAGNOSIS — E1169 Type 2 diabetes mellitus with other specified complication: Secondary | ICD-10-CM

## 2022-12-13 DIAGNOSIS — G35 Multiple sclerosis: Secondary | ICD-10-CM

## 2022-12-13 DIAGNOSIS — E1165 Type 2 diabetes mellitus with hyperglycemia: Secondary | ICD-10-CM

## 2022-12-13 DIAGNOSIS — E6609 Other obesity due to excess calories: Secondary | ICD-10-CM

## 2022-12-13 DIAGNOSIS — E66811 Obesity, class 1: Secondary | ICD-10-CM

## 2022-12-13 DIAGNOSIS — E559 Vitamin D deficiency, unspecified: Secondary | ICD-10-CM | POA: Diagnosis not present

## 2022-12-13 LAB — MICROALBUMIN / CREATININE URINE RATIO
Creatinine,U: 180.6 mg/dL
Microalb Creat Ratio: 0.6 mg/g (ref 0.0–30.0)
Microalb, Ur: 1 mg/dL (ref 0.0–1.9)

## 2022-12-13 LAB — LIPID PANEL
Cholesterol: 271 mg/dL — ABNORMAL HIGH (ref 0–200)
HDL: 50.9 mg/dL (ref >39.00)
LDL Cholesterol: 181 mg/dL — ABNORMAL HIGH (ref 0–99)
NonHDL: 220.28
Total CHOL/HDL Ratio: 5
Triglycerides: 195 mg/dL — ABNORMAL HIGH (ref 0.0–149.0)
VLDL: 39 mg/dL (ref 0.0–40.0)

## 2022-12-13 LAB — CBC WITH DIFFERENTIAL/PLATELET
Basophils Absolute: 0 10*3/uL (ref 0.0–0.1)
Basophils Relative: 0.8 % (ref 0.0–3.0)
Eosinophils Absolute: 0.3 10*3/uL (ref 0.0–0.7)
Eosinophils Relative: 6.5 % — ABNORMAL HIGH (ref 0.0–5.0)
HCT: 41.6 % (ref 36.0–46.0)
Hemoglobin: 13.1 g/dL (ref 12.0–15.0)
Lymphocytes Relative: 39.7 % (ref 12.0–46.0)
Lymphs Abs: 1.8 10*3/uL (ref 0.7–4.0)
MCHC: 31.6 g/dL (ref 30.0–36.0)
MCV: 83.7 fl (ref 78.0–100.0)
Monocytes Absolute: 0.3 10*3/uL (ref 0.1–1.0)
Monocytes Relative: 7.1 % (ref 3.0–12.0)
Neutro Abs: 2.1 10*3/uL (ref 1.4–7.7)
Neutrophils Relative %: 45.9 % (ref 43.0–77.0)
Platelets: 252 10*3/uL (ref 150.0–400.0)
RBC: 4.97 Mil/uL (ref 3.87–5.11)
RDW: 13.4 % (ref 11.5–15.5)
WBC: 4.5 10*3/uL (ref 4.0–10.5)

## 2022-12-13 LAB — COMPREHENSIVE METABOLIC PANEL
ALT: 42 U/L — ABNORMAL HIGH (ref 0–35)
AST: 21 U/L (ref 0–37)
Albumin: 4.6 g/dL (ref 3.5–5.2)
Alkaline Phosphatase: 52 U/L (ref 39–117)
BUN: 14 mg/dL (ref 6–23)
CO2: 27 meq/L (ref 19–32)
Calcium: 9.5 mg/dL (ref 8.4–10.5)
Chloride: 98 meq/L (ref 96–112)
Creatinine, Ser: 0.78 mg/dL (ref 0.40–1.20)
GFR: 85.27 mL/min (ref >60.00)
Glucose, Bld: 166 mg/dL — ABNORMAL HIGH (ref 70–99)
Potassium: 3.8 meq/L (ref 3.5–5.1)
Sodium: 132 meq/L — ABNORMAL LOW (ref 135–145)
Total Bilirubin: 0.3 mg/dL (ref 0.2–1.2)
Total Protein: 7.4 g/dL (ref 6.0–8.3)

## 2022-12-13 LAB — TSH: TSH: 1.1 u[IU]/mL (ref 0.35–5.50)

## 2022-12-13 LAB — POCT GLYCOSYLATED HEMOGLOBIN (HGB A1C): Hemoglobin A1C: 7.6 % — AB (ref 4.0–5.6)

## 2022-12-13 LAB — VITAMIN D 25 HYDROXY (VIT D DEFICIENCY, FRACTURES): VITD: 22.64 ng/mL — ABNORMAL LOW (ref 30.00–100.00)

## 2022-12-13 MED ORDER — METFORMIN HCL ER 750 MG PO TB24: 750.0000 mg | ORAL_TABLET | Freq: Every day | ORAL | 3 refills | Status: DC

## 2022-12-13 NOTE — Progress Notes (Signed)
Subjective  Chief Complaint  Patient presents with   Annual Exam    Pt here for Annual Exam and is currently fasting     HPI: Jacqueline Rice is a 57 y.o. female who presents to [facility] [place] today for a Female Wellness Visit. She also has the concerns and/or needs as listed above in the chief complaint. These will be addressed in addition to the Health Maintenance Visit.   Wellness Visit: annual visit with health maintenance review and exam without Pap  Health maintenance: Mammogram and Pap smear current.  Eye exam up-to-date reviewed.  No retinopathy.  Feeling well.  Has lost about 10 pounds since January.  Trying to eat better.  Not currently working but actively seeking employment.  56 year old twins are doing well, son is returning home from [place] after being there for a year and daughter is heading off to [employer].  We reviewed immunizations, likely due for Prevnar but need to clarify since pneumococcal vaccination was documented on an eye exam report.  Patient to research.  Other immunizations are current Chronic disease f/u and/or acute problem visit: (deemed necessary to be done in addition to the wellness visit): Diabetes: Newly diagnosed in January.  Patient elected to work on diet and exercise.  She denies symptoms of hyperglycemia.  No retinopathy on eye exam.  No foot conditions.  Currently not taking any medications. Hyperlipidemia: Due for recheck, she is fasting.  She never started the statin but would be open to that if needed. MS: Not currently on medications.  Needs follow-up with neurology.  Reports feeling well History of migraines: Fortunately she has been migraine free Vitamin D deficiency: Not currently supplementing  Assessment  1. Annual physical exam   2. Uncontrolled type 2 diabetes mellitus with hyperglycemia (HCC)   3. Combined hyperlipidemia associated with type 2 diabetes mellitus (HCC)   4. Relapsing remitting  multiple sclerosis (HCC)   5. Vitamin D deficiency   6. Class 1 obesity due to excess calories with serious comorbidity and body mass index (BMI) of 30.0 to 30.9 in adult   7. Migraine without aura and without status migrainosus, not intractable      Plan  Female Wellness Visit: Age appropriate Health Maintenance and Prevention measures were discussed with patient. Included topics are cancer screening recommendations, ways to keep healthy (see AVS) including dietary and exercise recommendations, regular eye and dental care, use of seat belts, and avoidance of moderate alcohol use and tobacco use.  Screens are current BMI: discussed patient's BMI and encouraged positive lifestyle modifications to help get to or maintain a target BMI. HM needs and immunizations were addressed and ordered. See below for orders. See HM and immunization section for updates.  Discussed possible need for Prevnar 20.  Patient will research her records and then follow-up at next visit Routine labs and screening tests ordered including cmp, cbc and lipids where appropriate. Discussed recommendations regarding Vit D and calcium supplementation (see AVS)  Chronic disease management visit and/or acute problem visit: Diabetes: Uncontrolled.  Education given.  Continue healthy diet.  Restart metformin XR 750 daily.  Recheck in 3 months.  Check urine nephropathy screen today.  She is normotensive.  Discussed goals of care.  Eye exam up-to-date without retinopathy.  She is not on an ACE inhibitor. History of hyperlipidemia: Recheck today and start statin if indicated.  Patient agrees Migraines: Stable Obesity: Significant weight loss over the last year, she will continue with diet  and exercise. MS: Reports this is stable as well.  She will follow-up with neurology Widow status: She has recovered well.  Both her children are heading off to college so she will be looking for work and managing the transition.  Follow up: 3 months  for diabetes Orders Placed This Encounter  Procedures   CBC with Differential/Platelet   Comprehensive metabolic panel   Lipid panel   Hemoglobin A1c   TSH   Microalbumin / creatinine urine ratio   VITAMIN D 25 Hydroxy (Vit-D Deficiency, Fractures)   POCT HgB A1C   Meds ordered this encounter  Medications   metFORMIN (GLUCOPHAGE-XR) 750 MG 24 hr tablet    Sig: Take 1 tablet (750 mg total) by mouth daily with breakfast.    Dispense:  90 tablet    Refill:  3      Body mass index is 30.95 kg/m. Wt Readings from Last 3 Encounters:  12/13/22 169 lb 3.2 oz (76.7 kg)  05/01/22 179 lb 6.4 oz (81.4 kg)  12/12/21 175 lb 3.2 oz (79.5 kg)     Patient Active Problem List   Diagnosis Date Noted Date Diagnosed   Class 1 obesity due to excess calories with serious comorbidity and body mass index (BMI) of 30.0 to 30.9 in adult 12/13/2022     Priority: High   Uncontrolled type 2 diabetes mellitus with hyperglycemia (HCC) 05/01/2022     Priority: High    A1c 6.29 September 2020, 7.27 November 2021, 7.04 May 2022: 1st visit for diabetes.     Combined hyperlipidemia associated with type 2 diabetes mellitus (HCC) 05/01/2022     Priority: High    Patient needs statin declined use at this time.  Needs follow-up.    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 11/16/2020     Priority: High   Migraine without aura 06/01/2020     Priority: High   Postherpetic neuralgia 06/01/2020     Priority: High   Vitamin D deficiency 10/07/2016     Priority: Low   Health Maintenance  Topic Date Due   FOOT EXAM  Never done   Diabetic kidney evaluation - Urine ACR  Never done   Diabetic kidney evaluation - eGFR measurement  12/13/2022   COVID-19 Vaccine (5 - 2023-24 season) 12/29/2022 (Originally 12/28/2021)   INFLUENZA VACCINE  02/11/2023 (Originally 11/28/2022)   MAMMOGRAM  06/06/2023   HEMOGLOBIN A1C  06/15/2023   OPHTHALMOLOGY EXAM  12/11/2023   PAP  SMEAR-Modifier  12/11/2025   DTaP/Tdap/Td (2 - Td or Tdap) 10/01/2027   Colonoscopy  02/24/2028   Hepatitis C Screening  Completed   HIV Screening  Completed   Zoster Vaccines- Shingrix  Completed   HPV VACCINES  Aged Out   Immunization History  Administered Date(s) Administered   Influenza Inj Mdck Quad Pf 02/14/2018   Influenza,inj,Quad PF,6+ Mos 02/02/2017, 02/04/2019, 02/24/2020, 02/15/2021, 03/13/2022   PFIZER Comirnaty(Gray Top)Covid-19 Tri-Sucrose Vaccine 08/19/2020   PFIZER(Purple Top)SARS-COV-2 Vaccination 07/04/2019, 07/25/2019, 03/11/2020   Tdap 09/30/2017   Zoster Recombinant(Shingrix) 08/17/2019, 10/27/2019   We updated and reviewed the patient's past history in detail and it is documented below. Allergies: Patient is allergic to amoxicillin and penicillins. Past Medical History Patient  has a past medical history of Allergy, Gestational diabetes (2005), and Vitamin D deficiency (10/07/2016). Past Surgical History Patient  has a past surgical history that includes Ovarian cyst removal (20 years ago) and Cholecystectomy. Family History: Patient family history includes  Breast cancer in her paternal aunt; Diabetes in her mother; Healthy in her daughter and son; High Cholesterol in her father; High blood pressure in her father; Mental illness in her brother and mother. Social History:  Patient  reports that she has never smoked. She has never used smokeless tobacco. She reports that she does not drink alcohol and does not use drugs.  Review of Systems: Constitutional: negative for fever or malaise Ophthalmic: negative for photophobia, double vision or loss of vision Cardiovascular: negative for chest pain, dyspnea on exertion, or new LE swelling Respiratory: negative for SOB or persistent cough Gastrointestinal: negative for abdominal pain, change in bowel habits or melena Genitourinary: negative for dysuria or gross hematuria, no abnormal uterine bleeding or  disharge Musculoskeletal: negative for new gait disturbance or muscular weakness Integumentary: negative for new or persistent rashes, no breast lumps Neurological: negative for TIA or stroke symptoms Psychiatric: negative for SI or delusions Allergic/Immunologic: negative for hives  Patient Care Team    Relationship Specialty Notifications Start End  Willow Ora, MD PCP - General Family Medicine  10/04/19   Olivia Canter, MD Consulting Physician Ophthalmology  10/12/18   Levert Feinstein, MD Consulting Physician Neurology  10/04/19     Objective  Vitals: BP 120/72   Pulse 77   Temp 98 F (36.7 C)   Ht 5\' 2"  (1.575 m)   Wt 169 lb 3.2 oz (76.7 kg)   SpO2 94%   BMI 30.95 kg/m  General:  Well developed, well nourished, no acute distress  Psych:  Alert and orientedx3,normal mood and affect HEENT:  Normocephalic, atraumatic, non-icteric sclera,  supple neck without adenopathy, mass or thyromegaly Cardiovascular:  Normal S1, S2, RRR without gallop, rub or murmur Respiratory:  Good breath sounds bilaterally, CTAB with normal respiratory effort Gastrointestinal: normal bowel sounds, soft, non-tender, no noted masses. No HSM MSK: extremities without edema, joints without erythema or swelling Neurologic:    Mental status is normal.  Gross motor and sensory exams are normal.  No tremor Diabetic Foot Exam: Appearance - no lesions, ulcers or significant calluses Skin - no sigificant pallor or erythema Normal sensation Pulses - +2 distally bilaterally   Commons side effects, risks, benefits, and alternatives for medications and treatment plan prescribed today were discussed, and the patient expressed understanding of the given instructions. Patient is instructed to call or message via MyChart if he/she has any questions or concerns regarding our treatment plan. No barriers to understanding were identified. We discussed Red Flag symptoms and signs in detail. Patient expressed understanding  regarding what to do in case of urgent or emergency type symptoms.  Medication list was reconciled, printed and provided to the patient in AVS. Patient instructions and summary information was reviewed with the patient as documented in the AVS. This note was prepared with assistance of Dragon voice recognition software. Occasional wrong-word or sound-a-like substitutions may have occurred due to the inherent limitations of voice recognition software

## 2022-12-13 NOTE — Patient Instructions (Signed)
Please return in 3 months for diabetes follow up   I will release your lab results to you on your MyChart account with further instructions. You may see the results before I do, but when I review them I will send you a message with my report or have my assistant call you if things need to be discussed. Please reply to my message with any questions. Thank you!   If you have any questions or concerns, please don't hesitate to send me a message via MyChart or call the office at [phone]. Thank you for visiting with [place] today! It's our pleasure caring for you.   Diabetes Mellitus and Standards of Medical Care Living with and managing diabetes (diabetes mellitus) can be complicated. Your diabetes treatment may be managed by a team of health care providers, including: A physician who specializes in diabetes (endocrinologist). You might also have visits with a nurse practitioner or physician assistant. Nurses. A registered dietitian. A certified diabetes care and education specialist. An exercise specialist. A pharmacist. An eye doctor. A foot specialist (podiatrist). A dental care provider. A primary care provider. A mental health care provider. How to manage your diabetes You can do many things to successfully manage your diabetes. Your health care providers will follow guidelines to help you get the best quality of care. Here are general guidelines for your diabetes management plan. Your health care providers may give you more specific instructions. Physical exams When you are diagnosed with diabetes, and each year after that, your health care provider will ask about your medical and family history. You will have a physical exam, which may include: Measuring your height, weight, and body mass index (BMI). Checking your blood pressure. This will be done at every routine medical visit. Your target blood pressure may vary depending on your medical conditions, your age, and other factors. A  thyroid exam. A skin exam. Screening for nerve damage (peripheral neuropathy). This may include checking the pulse in your legs and feet and the level of sensation in your hands and feet. A foot exam to inspect the structure and skin of your feet, including checking for cuts, bruises, redness, blisters, sores, or other problems. Screening for blood vessel (vascular) problems. This may include checking the pulse in your legs and feet and checking your temperature. Blood tests Depending on your treatment plan and your personal needs, you may have the following tests: Hemoglobin A1C (HbA1C). This test provides information about blood sugar (glucose) control over the previous 2-3 months. It is used to adjust your treatment plan, if needed. This test will be done: At least 2 times a year, if you are meeting your treatment goals. 4 times a year, if you are not meeting your treatment goals or if your goals have changed. Lipid testing, including total cholesterol, LDL and HDL cholesterol, and triglyceride levels. The goal for LDL is less than 100 mg/dL (5.5 mmol/L). If you are at high risk for complications, the goal is less than 70 mg/dL (3.9 mmol/L). The goal for HDL is 40 mg/dL (2.2 mmol/L) or higher for men, and 50 mg/dL (2.8 mmol/L) or higher for women. An HDL cholesterol of 60 mg/dL (3.3 mmol/L) or higher gives some protection against heart disease. The goal for triglycerides is less than 150 mg/dL (8.3 mmol/L). Liver function tests. Kidney function tests. Thyroid function tests.  Dental and eye exams  Visit your dentist two times a year. If you have type 1 diabetes, your health care provider may recommend an  eye exam within 5 years after you are diagnosed, and then once a year after your first exam. For children with type 1 diabetes, the health care provider may recommend an eye exam when your child is age 67 or older and has had diabetes for 3-5 years. After the first exam, your child should  get an eye exam once a year. If you have type 2 diabetes, your health care provider may recommend an eye exam as soon as you are diagnosed, and then every 1-2 years after your first exam. Immunizations A yearly flu (influenza) vaccine is recommended annually for everyone 6 months or older. This is especially important if you have diabetes. The pneumonia (pneumococcal) vaccine is recommended for everyone 2 years or older who has diabetes. If you are age 42 or older, you may get the pneumonia vaccine as a series of two separate shots. The hepatitis B vaccine is recommended for adults shortly after being diagnosed with diabetes. Adults and children with diabetes should receive all other vaccines according to age-specific recommendations from the Centers for Disease Control and Prevention (CDC). Mental and emotional health Screening for symptoms of eating disorders, anxiety, and depression is recommended at the time of diagnosis and after as needed. If your screening shows that you have symptoms, you may need more evaluation. You may work with a mental health care provider. Follow these instructions at home: Treatment plan You will monitor your blood glucose levels and may give yourself insulin. Your treatment plan will be reviewed at every medical visit. You and your health care provider will discuss: How you are taking your medicines, including insulin. Any side effects you have. Your blood glucose level target goals. How often you monitor your blood glucose level. Lifestyle habits, such as activity level and tobacco, alcohol, and substance use. Education Your health care provider will assess how well you are monitoring your blood glucose levels and whether you are taking your insulin and medicines correctly. He or she may refer you to: A certified diabetes care and education specialist to manage your diabetes throughout your life, starting at diagnosis. A registered dietitian who can create and  review your personal nutrition plan. An exercise specialist who can discuss your activity level and exercise plan. General instructions Take over-the-counter and prescription medicines only as told by your health care provider. Keep all follow-up visits. This is important. Where to find support There are many diabetes support networks, including: American Diabetes Association (ADA): diabetes.org Defeat Diabetes Foundation: defeatdiabetes.org Where to find more information American Diabetes Association (ADA): www.diabetes.org Association of Diabetes Care & Education Specialists (ADCES): diabeteseducator.org International Diabetes Federation (IDF): http://hill.biz/ Summary Managing diabetes (diabetes mellitus) can be complicated. Your diabetes treatment may be managed by a team of health care providers. Your health care providers follow guidelines to help you get the best quality care. You should have physical exams, blood tests, blood pressure monitoring, immunizations, and screening tests regularly. Stay updated on how to manage your diabetes. Your health care providers may also give you more specific instructions based on your individual health. This information is not intended to replace advice given to you by your health care provider. Make sure you discuss any questions you have with your health care provider. Document Revised: 10/21/2019 Document Reviewed: 10/21/2019 Elsevier Patient Education  2024 [employer].

## 2022-12-16 ENCOUNTER — Other Ambulatory Visit: Payer: Self-pay | Admitting: Oncology

## 2022-12-16 DIAGNOSIS — Z006 Encounter for examination for normal comparison and control in clinical research program: Secondary | ICD-10-CM

## 2022-12-16 LAB — HEMOGLOBIN A1C: Hgb A1c MFr Bld: 8.2 % — ABNORMAL HIGH (ref 4.6–6.5)

## 2022-12-16 NOTE — Progress Notes (Signed)
[facility] - Clinic Note  12/18/2022   CHIEF COMPLAINT Patient presents for Retina Evaluation  HISTORY OF PRESENT ILLNESS: Jacqueline Rice is a 57 y.o. female who presents to the clinic today for:  HPI     Retina Evaluation   In left eye.  This started 1 week ago.  Associated Symptoms Negative for Flashes, Floaters, Distortion and Blind Spot.  I, the attending physician,  performed the HPI with the patient and updated documentation appropriately.        Comments   Patient is here today based on a referral from Dr. Laruth Bouchard for a retinal hole in the left eye. She has been a diabetic about a year. She does not check her blood sugar. She is not using eye drops.       Last edited by Rennis Chris, MD on 12/18/2022 12:02 PM.    Pt is here on the referral of Dr. Fabian Sharp for concern of retinal hole OS, pt states she saw him for a routine eye exam and is not having any problems, pt states she has OTC reading glasses and glasses for driving, she denies any eye trauma or eye surgeries, pt endorses being diabetic and taking metformin, she denies being hypertensive   Referring physician: Olivia Canter, MD [address] [place],  [place] [address]  HISTORICAL INFORMATION:  Selected notes from the MEDICAL RECORD NUMBER Referred by Dr. Fabian Sharp for atrophic retinal hole OS LEE:  Ocular Hx- PMH-   CURRENT MEDICATIONS: Current Outpatient Medications (Ophthalmic Drugs)  Medication Sig   prednisoLONE acetate (PRED FORTE) 1 % ophthalmic suspension Place 1 drop into the left eye 4 (four) times daily for 7 days.   No current facility-administered medications for this visit. (Ophthalmic Drugs)   Current Outpatient Medications (Other)  Medication Sig   Ascorbic Acid (VITAMIN C PO) Take by mouth.   cetirizine (ZYRTEC) 10 MG tablet Take 10 mg by mouth daily.   fluticasone (FLONASE) 50 MCG/ACT nasal spray Place into both nostrils daily.   metFORMIN  (GLUCOPHAGE-XR) 750 MG 24 hr tablet Take 1 tablet (750 mg total) by mouth daily with breakfast.   Multiple Vitamin (MULTIVITAMIN ADULT PO) Take by mouth. OTC   VITAMIN D, ERGOCALCIFEROL, PO Take by mouth.   Zinc 10 MG LOZG Use as directed in the mouth or throat.   No current facility-administered medications for this visit. (Other)   REVIEW OF SYSTEMS: ROS   Positive for: Eyes Last edited by Charlette Caffey, COT on 12/18/2022  8:47 AM.     ALLERGIES Allergies  Allergen Reactions   Amoxicillin Nausea And Vomiting   Penicillins Nausea And Vomiting   PAST MEDICAL HISTORY Past Medical History:  Diagnosis Date   Allergy    Gestational diabetes 2005   Vitamin D deficiency 10/07/2016   Past Surgical History:  Procedure Laterality Date   CHOLECYSTECTOMY     OVARIAN CYST REMOVAL  20 years ago   FAMILY HISTORY Family History  Problem Relation Age of Onset   Diabetes Mother    Mental illness Mother    High blood pressure Father    High Cholesterol Father    Mental illness Brother    Breast cancer Paternal Aunt    Healthy Daughter    Healthy Son    Colon cancer Neg Hx    Esophageal cancer Neg Hx    Rectal cancer Neg Hx    Stomach cancer Neg Hx  SOCIAL HISTORY Social History   Tobacco Use   Smoking status: Never   Smokeless tobacco: Never  Vaping Use   Vaping status: Never Used  Substance Use Topics   Alcohol use: No   Drug use: No       OPHTHALMIC EXAM:  Base Eye Exam     Visual Acuity (Snellen - Linear)       Right Left   Dist cc 20/20 20/20    Correction: Glasses         Tonometry (Tonopen, 8:51 AM)       Right Left   Pressure 16 14         Pupils       Dark Light Shape React APD   Right 4 3 Round Brisk None   Left 4 3 Round Brisk None         Visual Fields       Left Right    Full Full         Extraocular Movement       Right Left    Full, Ortho Full, Ortho         Neuro/Psych     Oriented x3: Yes          Dilation     Both eyes: 1.0% Mydriacyl, 2.5% Phenylephrine @ 8:49 AM           Slit Lamp and Fundus Exam     Slit Lamp Exam       Right Left   Lids/Lashes Dermatochalasis - upper lid Dermatochalasis - upper lid   Conjunctiva/Sclera White and quiet White and quiet   Cornea Clear Clear   Anterior Chamber deep and clear deep and clear   Iris Round and dilated Round and dilated   Lens 2+ Nuclear sclerosis, 2+ Cortical cataract 2+ Nuclear sclerosis, 2+ Cortical cataract   Anterior Vitreous mild syneresis mild syneresis         Fundus Exam       Right Left   Disc Pink and Sharp, mild PPP Pink and Sharp, mild temporal PPP   C/D Ratio 0.4 0.4   Macula Flat, Good foveal reflex, No heme or edema Flat, Good foveal reflex, RPE mottling, No heme or edema   Vessels mild tortuosity mild tortuosity   Periphery Attached, mild midzonal drusen superiorly, focal pigmented lattice from 1030-1100 and at 0100, No heme Attached, pigmented lattice 1200-0100 and 1100, pigmented lattice with atrophic hole at 0530           Refraction     Wearing Rx       Sphere Cylinder   Right -0.50 Sphere   Left -1.50 Sphere           IMAGING AND PROCEDURES  Imaging and Procedures for 12/18/2022  OCT, Retina - OU - Both Eyes       Right Eye Quality was good. Central Foveal Thickness: 262. Progression has no prior data. Findings include normal foveal contour, no IRF, no SRF, vitreomacular adhesion .   Left Eye Quality was good. Central Foveal Thickness: 271. Progression has no prior data. Findings include normal foveal contour, no IRF, no SRF, vitreomacular adhesion .   Notes *Images captured and stored on drive  Diagnosis / Impression:  NFP, no IRF/SRF OU No DME OU  Clinical management:  See below  Abbreviations: NFP - Normal foveal profile. CME - cystoid macular edema. PED - pigment epithelial detachment. IRF - intraretinal fluid. SRF - subretinal fluid. EZ - ellipsoid  zone. ERM -  epiretinal membrane. ORA - outer retinal atrophy. ORT - outer retinal tubulation. SRHM - subretinal hyper-reflective material. IRHM - intraretinal hyper-reflective material      Repair Retinal Breaks, Laser - OS - Left Eye       LASER PROCEDURE NOTE  Procedure:  Barrier laser retinopexy using slit lamp laser, LEFT eye   Diagnosis:   Lattice degeneration w/ atrophic holes, LEFT eye                     Patches of lattice: ST periphery (1200-0100); 1100; and 0530; atrophic hole at 0530  Surgeon: Rennis Chris, MD, PhD  Anesthesia: Topical  Informed consent obtained, operative eye marked, and time out performed prior to initiation of laser.   Laser settings:  Lumenis Smart532 laser, slit lamp Lens: Mainster PRP 165 Power: 340 mW Spot size: 200 microns Duration: 50 msec  # spots: 568  Placement of laser: Using a Mainster PRP 165 contact lens at the slit lamp, laser was placed in three confluent rows around patches of lattice w/ atrophic holes from 1200-0100, 0530, and 1100 oclock.  Complications: None.  Patient tolerated the procedure well and received written and verbal post-procedure care information/education.          ASSESSMENT/PLAN:   ICD-10-CM   1. Bilateral retinal lattice degeneration  H35.413 Repair Retinal Breaks, Laser - OS - Left Eye    2. Retinal hole of both eyes  H33.323 Repair Retinal Breaks, Laser - OS - Left Eye    3. Diabetes mellitus type 2 without retinopathy (HCC)  E11.9 OCT, Retina - OU - Both Eyes    4. Long term (current) use of oral hypoglycemic drugs  Z79.84     5. Combined forms of age-related cataract of both eyes  H25.813      1,2. Lattice degeneration w/ atrophic holes, both eyes  - OD: focal pigmented lattice at 1100 and 0100 - OS: pigmented lattice ST quad (1200-0100) and at 0530 (with atrophic hole), and at 1100 - discussed findings, prognosis, and treatment options including observation - recommend laser retinopexy OU -- OS first  today, 08.21.24 - pt wishes to proceed with laser - RBA of procedure discussed, questions answered - informed consent obtained and signed - see procedure note - start PF QID OS x7 days - f/u in 2-3 wks, sooner prn -- POV OS and possible laser retinopexy OD  3,4. Diabetes mellitus, type 2 without retinopathy - The incidence, risk factors for progression, natural history and treatment options for diabetic retinopathy  were discussed with patient.   - The need for close monitoring of blood glucose, blood pressure, and serum lipids, avoiding cigarette or any type of tobacco, and the need for long term follow up was also discussed with patient. - f/u in 1 year, sooner prn  5. Mixed Cataract OU - The symptoms of cataract, surgical options, and treatments and risks were discussed with patient. - discussed diagnosis and progression - not yet visually significant - monitor for now  Ophthalmic Meds Ordered this visit:  Meds ordered this encounter  Medications   prednisoLONE acetate (PRED FORTE) 1 % ophthalmic suspension    Sig: Place 1 drop into the left eye 4 (four) times daily for 7 days.    Dispense:  10 mL    Refill:  0     Return in about 2 weeks (around 01/01/2023) for f/u Sept 4 at 945, lattice degeneration OU, DFE, OCT.  There are  no Patient Instructions on file for this visit.  Explained the diagnoses, plan, and follow up with the patient and they expressed understanding.  Patient expressed understanding of the importance of proper follow up care.   This document serves as a record of services personally performed by Karie Chimera, MD, PhD. It was created on their behalf by De Blanch, an ophthalmic technician. The creation of this record is the provider's dictation and/or activities during the visit.    Electronically signed by: De Blanch, OA, 12/18/22  12:09 PM  This document serves as a record of services personally performed by Karie Chimera, MD, PhD. It was  created on their behalf by Glee Arvin. Manson Passey, OA an ophthalmic technician. The creation of this record is the provider's dictation and/or activities during the visit.    Electronically signed by: Glee Arvin. Manson Passey, OA 12/18/22 12:09 PM  Karie Chimera, M.D., Ph.D. Diseases & Surgery of the Retina and Vitreous [facility] 12/18/2022  I have reviewed the above documentation for accuracy and completeness, and I agree with the above. Karie Chimera, M.D., Ph.D. 12/18/22 12:11 PM   Abbreviations: M myopia (nearsighted); A astigmatism; H hyperopia (farsighted); P presbyopia; Mrx spectacle prescription;  CTL contact lenses; OD right eye; OS left eye; OU both eyes  XT exotropia; ET esotropia; PEK punctate epithelial keratitis; PEE punctate epithelial erosions; DES dry eye syndrome; MGD meibomian gland dysfunction; ATs artificial tears; PFAT's preservative free artificial tears; NSC nuclear sclerotic cataract; PSC posterior subcapsular cataract; ERM epi-retinal membrane; PVD posterior vitreous detachment; RD retinal detachment; DM diabetes mellitus; DR diabetic retinopathy; NPDR non-proliferative diabetic retinopathy; PDR proliferative diabetic retinopathy; CSME clinically significant macular edema; DME diabetic macular edema; dbh dot blot hemorrhages; CWS cotton wool spot; POAG primary open angle glaucoma; C/D cup-to-disc ratio; HVF humphrey visual field; GVF goldmann visual field; OCT optical coherence tomography; IOP intraocular pressure; BRVO Branch retinal vein occlusion; CRVO central retinal vein occlusion; CRAO central retinal artery occlusion; BRAO branch retinal artery occlusion; RT retinal tear; SB scleral buckle; PPV pars plana vitrectomy; VH Vitreous hemorrhage; PRP panretinal laser photocoagulation; IVK intravitreal kenalog; VMT vitreomacular traction; MH Macular hole;  NVD neovascularization of the disc; NVE neovascularization elsewhere; AREDS age related eye disease study; ARMD  age related macular degeneration; POAG primary open angle glaucoma; EBMD epithelial/anterior basement membrane dystrophy; ACIOL anterior chamber intraocular lens; IOL intraocular lens; PCIOL posterior chamber intraocular lens; Phaco/IOL phacoemulsification with intraocular lens placement; PRK photorefractive keratectomy; LASIK laser assisted in situ keratomileusis; HTN hypertension; DM diabetes mellitus; COPD chronic obstructive pulmonary disease

## 2022-12-18 ENCOUNTER — Encounter (INDEPENDENT_AMBULATORY_CARE_PROVIDER_SITE_OTHER): Payer: Self-pay | Admitting: Ophthalmology

## 2022-12-18 ENCOUNTER — Ambulatory Visit (INDEPENDENT_AMBULATORY_CARE_PROVIDER_SITE_OTHER): Payer: 59 | Admitting: Ophthalmology

## 2022-12-18 DIAGNOSIS — Z7984 Long term (current) use of oral hypoglycemic drugs: Secondary | ICD-10-CM

## 2022-12-18 DIAGNOSIS — E119 Type 2 diabetes mellitus without complications: Secondary | ICD-10-CM

## 2022-12-18 DIAGNOSIS — H35413 Lattice degeneration of retina, bilateral: Secondary | ICD-10-CM

## 2022-12-18 DIAGNOSIS — H25813 Combined forms of age-related cataract, bilateral: Secondary | ICD-10-CM | POA: Diagnosis not present

## 2022-12-18 DIAGNOSIS — H33323 Round hole, bilateral: Secondary | ICD-10-CM

## 2022-12-18 DIAGNOSIS — H3581 Retinal edema: Secondary | ICD-10-CM

## 2022-12-18 MED ORDER — PREDNISOLONE ACETATE 1 % OP SUSP: 1.0000 [drp] | Freq: Four times a day (QID) | OPHTHALMIC | 0 refills | Status: AC

## 2022-12-26 NOTE — Progress Notes (Signed)
[facility] - Clinic Note  01/01/2023   CHIEF COMPLAINT Patient presents for Retina Follow Up  HISTORY OF PRESENT ILLNESS: Jacqueline Rice is a 57 y.o. female who presents to the clinic today for:  HPI     Retina Follow Up   Patient presents with  Other.  In both eyes.  This started 2 weeks ago.  I, the attending physician,  performed the HPI with the patient and updated documentation appropriately.        Comments   Patient here for 2 weeks for retina follow up for lattice degeneration OU. Patient states vision doing good. . Has a little bit of eye pain after surgery 2 weeks ago. Used drop for a week. Not using now.       Last edited by Rennis Chris, MD on 01/01/2023 11:35 AM.    Pt states she had no problems after the laser procedure at last visit, she is prepared for laser in the right eye today   Referring physician: Olivia Canter, MD [address] [place],  [place] [address]  HISTORICAL INFORMATION:  Selected notes from the MEDICAL RECORD NUMBER Referred by Dr. Fabian Sharp for atrophic retinal hole OS LEE:  Ocular Hx- PMH-   CURRENT MEDICATIONS: No current outpatient medications on file. (Ophthalmic Drugs)   No current facility-administered medications for this visit. (Ophthalmic Drugs)   Current Outpatient Medications (Other)  Medication Sig   Ascorbic Acid (VITAMIN C PO) Take by mouth.   cetirizine (ZYRTEC) 10 MG tablet Take 10 mg by mouth daily.   fluticasone (FLONASE) 50 MCG/ACT nasal spray Place into both nostrils daily.   metFORMIN (GLUCOPHAGE-XR) 750 MG 24 hr tablet Take 1 tablet (750 mg total) by mouth daily with breakfast.   Multiple Vitamin (MULTIVITAMIN ADULT PO) Take by mouth. OTC   VITAMIN D, ERGOCALCIFEROL, PO Take by mouth.   Zinc 10 MG LOZG Use as directed in the mouth or throat.   No current facility-administered medications for this visit. (Other)   REVIEW OF SYSTEMS: ROS   Positive for: Eyes Last edited by  Laddie Aquas, COA on 01/01/2023 10:16 AM.      ALLERGIES Allergies  Allergen Reactions   Amoxicillin Nausea And Vomiting   Penicillins Nausea And Vomiting   PAST MEDICAL HISTORY Past Medical History:  Diagnosis Date   Allergy    Gestational diabetes 2005   Vitamin D deficiency 10/07/2016   Past Surgical History:  Procedure Laterality Date   CHOLECYSTECTOMY     OVARIAN CYST REMOVAL  20 years ago   FAMILY HISTORY Family History  Problem Relation Age of Onset   Diabetes Mother    Mental illness Mother    High blood pressure Father    High Cholesterol Father    Mental illness Brother    Breast cancer Paternal Aunt    Healthy Daughter    Healthy Son    Colon cancer Neg Hx    Esophageal cancer Neg Hx    Rectal cancer Neg Hx    Stomach cancer Neg Hx    SOCIAL HISTORY Social History   Tobacco Use   Smoking status: Never   Smokeless tobacco: Never  Vaping Use   Vaping status: Never Used  Substance Use Topics   Alcohol use: No   Drug use: No       OPHTHALMIC EXAM:  Base Eye Exam     Visual Acuity (Snellen - Linear)  Right Left   Dist cc 20/20 20/20    Correction: Glasses         Tonometry (Tonopen, 10:14 AM)       Right Left   Pressure 18 14         Pupils       Dark Light Shape React APD   Right 4 3 Round Brisk None   Left 4 3 Round Brisk None         Visual Fields (Counting fingers)       Left Right    Full Full         Extraocular Movement       Right Left    Full, Ortho Full, Ortho         Neuro/Psych     Oriented x3: Yes   Mood/Affect: Normal         Dilation     Both eyes: 1.0% Mydriacyl, 2.5% Phenylephrine @ 10:14 AM           Slit Lamp and Fundus Exam     Slit Lamp Exam       Right Left   Lids/Lashes Dermatochalasis - upper lid Dermatochalasis - upper lid   Conjunctiva/Sclera White and quiet White and quiet   Cornea Clear Clear   Anterior Chamber deep and clear deep and clear   Iris Round  and dilated Round and dilated   Lens 2+ Nuclear sclerosis, 2+ Cortical cataract 2+ Nuclear sclerosis, 2+ Cortical cataract   Anterior Vitreous mild syneresis mild syneresis         Fundus Exam       Right Left   Disc Pink and Sharp, mild PPP Pink and Sharp, mild temporal PPP   C/D Ratio 0.4 0.4   Macula Flat, Good foveal reflex, No heme or edema Flat, Good foveal reflex, RPE mottling, No heme or edema   Vessels mild tortuosity mild tortuosity   Periphery Attached, mild midzonal drusen superiorly, focal pigmented lattice from 1030-1100 and at 0100, No heme Attached, pigmented lattice 1200-0100 and 1100, pigmented lattice with atrophic hole at 0530 -- good laser changes surrounding all lesions, no new RT/RD or lattice           Refraction     Wearing Rx       Sphere Cylinder   Right -0.50 Sphere   Left -1.50 Sphere           IMAGING AND PROCEDURES  Imaging and Procedures for 01/01/2023  OCT, Retina - OU - Both Eyes       Right Eye Quality was good. Central Foveal Thickness: 265. Progression has been stable. Findings include normal foveal contour, no IRF, no SRF, vitreomacular adhesion .   Left Eye Quality was good. Central Foveal Thickness: 279. Progression has been stable. Findings include normal foveal contour, no IRF, no SRF, vitreomacular adhesion .   Notes *Images captured and stored on drive  Diagnosis / Impression:  NFP, no IRF/SRF OU No DME OU  Clinical management:  See below  Abbreviations: NFP - Normal foveal profile. CME - cystoid macular edema. PED - pigment epithelial detachment. IRF - intraretinal fluid. SRF - subretinal fluid. EZ - ellipsoid zone. ERM - epiretinal membrane. ORA - outer retinal atrophy. ORT - outer retinal tubulation. SRHM - subretinal hyper-reflective material. IRHM - intraretinal hyper-reflective material      Repair Retinal Breaks, Laser - OD - Right Eye       LASER PROCEDURE NOTE  Procedure:  Elwyn Lade  laser retinopexy  using slit lamp laser, RIGHT eye   Diagnosis:   Lattice degeneration w/ atrophic holes, RIGHT eye                     Patches of pigmented lattice superior periphery from 1100 - 0100  Surgeon: Rennis Chris, MD, PhD  Anesthesia: Topical  Informed consent obtained, operative eye marked, and time out performed prior to initiation of laser.   Laser settings:  Lumenis Smart532 laser, slit lamp Lens: Mainster PRP 165 Power: 360 mW Spot size: 200 microns Duration: 50 msec  # spots: 464  Placement of laser: Using a Mainster PRP 165 contact lens at the slit lamp, laser was placed in three confluent rows around patches of lattice w/ atrophic holes from 1100-0100 oclock anterior to equator.  Complications: None.  Patient tolerated the procedure well and received written and verbal post-procedure care information/education.          ASSESSMENT/PLAN:   ICD-10-CM   1. Bilateral retinal lattice degeneration  H35.413 OCT, Retina - OU - Both Eyes    Repair Retinal Breaks, Laser - OD - Right Eye    2. Retinal hole of both eyes  H33.323 Repair Retinal Breaks, Laser - OD - Right Eye    3. Diabetes mellitus type 2 without retinopathy (HCC)  E11.9 OCT, Retina - OU - Both Eyes    4. Long term (current) use of oral hypoglycemic drugs  Z79.84     5. Combined forms of age-related cataract of both eyes  H25.813       1,2. Lattice degeneration w/ atrophic holes, both eyes  - OD: focal pigmented lattice at 1100 and 0100 - OS: pigmented lattice ST quad (1200-0100) and at 0530 (with atrophic hole), and at 1100 - discussed findings, prognosis, and treatment options including observation - s/p laser retinopexy OS (08.21.24) -- good laser changes in place - recommend laser retinopexy OD today, 09.04.24 - pt wishes to proceed with laser OD - RBA of procedure discussed, questions answered - informed consent obtained and signed - see procedure note - start PF QID OD x7 days - f/u in 3-4 wks, sooner  prn -- DFE/OCT  3,4. Diabetes mellitus, type 2 without retinopathy - The incidence, risk factors for progression, natural history and treatment options for diabetic retinopathy  were discussed with patient.   - The need for close monitoring of blood glucose, blood pressure, and serum lipids, avoiding cigarette or any type of tobacco, and the need for long term follow up was also discussed with patient. - f/u in 1 year, sooner prn  5. Mixed Cataract OU - The symptoms of cataract, surgical options, and treatments and risks were discussed with patient. - discussed diagnosis and progression - monitor  Ophthalmic Meds Ordered this visit:  No orders of the defined types were placed in this encounter.    Return for f/u 3-4 weeks, lattice degeneration OU, DFE, OCT.  There are no Patient Instructions on file for this visit.  Explained the diagnoses, plan, and follow up with the patient and they expressed understanding.  Patient expressed understanding of the importance of proper follow up care.   This document serves as a record of services personally performed by Karie Chimera, MD, PhD. It was created on their behalf by De Blanch, an ophthalmic technician. The creation of this record is the provider's dictation and/or activities during the visit.    Electronically signed by: De Blanch, OA, 01/01/23  11:42 AM  This document serves as a record of services personally performed by Karie Chimera, MD, PhD. It was created on their behalf by Glee Arvin. Manson Passey, OA an ophthalmic technician. The creation of this record is the provider's dictation and/or activities during the visit.    Electronically signed by: Glee Arvin. Manson Passey, OA 01/01/23 11:42 AM  Karie Chimera, M.D., Ph.D. Diseases & Surgery of the Retina and Vitreous [facility] 01/01/2023  I have reviewed the above documentation for accuracy and completeness, and I agree with the above. Karie Chimera,  M.D., Ph.D. 01/01/23 11:44 AM   Abbreviations: M myopia (nearsighted); A astigmatism; H hyperopia (farsighted); P presbyopia; Mrx spectacle prescription;  CTL contact lenses; OD right eye; OS left eye; OU both eyes  XT exotropia; ET esotropia; PEK punctate epithelial keratitis; PEE punctate epithelial erosions; DES dry eye syndrome; MGD meibomian gland dysfunction; ATs artificial tears; PFAT's preservative free artificial tears; NSC nuclear sclerotic cataract; PSC posterior subcapsular cataract; ERM epi-retinal membrane; PVD posterior vitreous detachment; RD retinal detachment; DM diabetes mellitus; DR diabetic retinopathy; NPDR non-proliferative diabetic retinopathy; PDR proliferative diabetic retinopathy; CSME clinically significant macular edema; DME diabetic macular edema; dbh dot blot hemorrhages; CWS cotton wool spot; POAG primary open angle glaucoma; C/D cup-to-disc ratio; HVF humphrey visual field; GVF goldmann visual field; OCT optical coherence tomography; IOP intraocular pressure; BRVO Branch retinal vein occlusion; CRVO central retinal vein occlusion; CRAO central retinal artery occlusion; BRAO branch retinal artery occlusion; RT retinal tear; SB scleral buckle; PPV pars plana vitrectomy; VH Vitreous hemorrhage; PRP panretinal laser photocoagulation; IVK intravitreal kenalog; VMT vitreomacular traction; MH Macular hole;  NVD neovascularization of the disc; NVE neovascularization elsewhere; AREDS age related eye disease study; ARMD age related macular degeneration; POAG primary open angle glaucoma; EBMD epithelial/anterior basement membrane dystrophy; ACIOL anterior chamber intraocular lens; IOL intraocular lens; PCIOL posterior chamber intraocular lens; Phaco/IOL phacoemulsification with intraocular lens placement; PRK photorefractive keratectomy; LASIK laser assisted in situ keratomileusis; HTN hypertension; DM diabetes mellitus; COPD chronic obstructive pulmonary disease

## 2023-01-01 ENCOUNTER — Ambulatory Visit (INDEPENDENT_AMBULATORY_CARE_PROVIDER_SITE_OTHER): Payer: 59 | Admitting: Ophthalmology

## 2023-01-01 ENCOUNTER — Encounter (INDEPENDENT_AMBULATORY_CARE_PROVIDER_SITE_OTHER): Payer: Self-pay | Admitting: Ophthalmology

## 2023-01-01 DIAGNOSIS — Z7984 Long term (current) use of oral hypoglycemic drugs: Secondary | ICD-10-CM | POA: Diagnosis not present

## 2023-01-01 DIAGNOSIS — E119 Type 2 diabetes mellitus without complications: Secondary | ICD-10-CM | POA: Diagnosis not present

## 2023-01-01 DIAGNOSIS — H25813 Combined forms of age-related cataract, bilateral: Secondary | ICD-10-CM

## 2023-01-01 DIAGNOSIS — H33323 Round hole, bilateral: Secondary | ICD-10-CM

## 2023-01-01 DIAGNOSIS — H35413 Lattice degeneration of retina, bilateral: Secondary | ICD-10-CM

## 2023-01-17 NOTE — Progress Notes (Signed)
[facility] - Clinic Note  01/23/2023   CHIEF COMPLAINT Patient presents for Retina Follow Up  HISTORY OF PRESENT ILLNESS: Jacqueline Rice is a 57 y.o. female who presents to the clinic today for:  HPI     Retina Follow Up   Patient presents with  Other.  In both eyes.  This started 3 weeks ago.  I, the attending physician,  performed the HPI with the patient and updated documentation appropriately.        Comments   Patient here for 3 weeks retina follow up for lattice degeneration OU. Patient states vision doing good. No eye pain. Not using drops now.      Last edited by Rennis Chris, MD on 01/24/2023  1:08 AM.    Patient states that she is seeing floaters every now and then.   Referring physician: Olivia Canter, MD [address] [place],  [place] [address]  HISTORICAL INFORMATION:  Selected notes from the MEDICAL RECORD NUMBER Referred by Dr. Fabian Sharp for atrophic retinal hole OS LEE:  Ocular Hx- PMH-   CURRENT MEDICATIONS: No current outpatient medications on file. (Ophthalmic Drugs)   No current facility-administered medications for this visit. (Ophthalmic Drugs)   Current Outpatient Medications (Other)  Medication Sig   Ascorbic Acid (VITAMIN C PO) Take by mouth.   cetirizine (ZYRTEC) 10 MG tablet Take 10 mg by mouth daily.   fluticasone (FLONASE) 50 MCG/ACT nasal spray Place into both nostrils daily.   metFORMIN (GLUCOPHAGE-XR) 750 MG 24 hr tablet Take 1 tablet (750 mg total) by mouth daily with breakfast.   Multiple Vitamin (MULTIVITAMIN ADULT PO) Take by mouth. OTC   VITAMIN D, ERGOCALCIFEROL, PO Take by mouth.   Zinc 10 MG LOZG Use as directed in the mouth or throat.   No current facility-administered medications for this visit. (Other)   REVIEW OF SYSTEMS: ROS   Positive for: Eyes Last edited by Laddie Aquas, COA on 01/23/2023  9:24 AM.       ALLERGIES Allergies  Allergen Reactions   Amoxicillin Nausea And  Vomiting   Penicillins Nausea And Vomiting   PAST MEDICAL HISTORY Past Medical History:  Diagnosis Date   Allergy    Gestational diabetes 2005   Vitamin D deficiency 10/07/2016   Past Surgical History:  Procedure Laterality Date   CHOLECYSTECTOMY     OVARIAN CYST REMOVAL  20 years ago   FAMILY HISTORY Family History  Problem Relation Age of Onset   Diabetes Mother    Mental illness Mother    High blood pressure Father    High Cholesterol Father    Mental illness Brother    Breast cancer Paternal Aunt    Healthy Daughter    Healthy Son    Colon cancer Neg Hx    Esophageal cancer Neg Hx    Rectal cancer Neg Hx    Stomach cancer Neg Hx    SOCIAL HISTORY Social History   Tobacco Use   Smoking status: Never   Smokeless tobacco: Never  Vaping Use   Vaping status: Never Used  Substance Use Topics   Alcohol use: No   Drug use: No       OPHTHALMIC EXAM:  Base Eye Exam     Visual Acuity (Snellen - Linear)       Right Left   Dist cc 20/20 20/20    Correction: Glasses         Tonometry (Tonopen, 9:22  AM)       Right Left   Pressure 10 08         Pupils       Dark Light Shape React APD   Right 4 3 Round Brisk None   Left 4 3 Round Brisk None         Visual Fields (Counting fingers)       Left Right    Full Full         Extraocular Movement       Right Left    Full, Ortho Full, Ortho         Neuro/Psych     Oriented x3: Yes   Mood/Affect: Normal         Dilation     Both eyes: 1.0% Mydriacyl, 2.5% Phenylephrine @ 9:22 AM           Slit Lamp and Fundus Exam     Slit Lamp Exam       Right Left   Lids/Lashes Dermatochalasis - upper lid Dermatochalasis - upper lid   Conjunctiva/Sclera White and quiet White and quiet   Cornea Clear Clear   Anterior Chamber deep and clear deep and clear   Iris Round and dilated Round and dilated   Lens 2+ Nuclear sclerosis, 2+ Cortical cataract 2+ Nuclear sclerosis, 2+ Cortical cataract    Anterior Vitreous mild syneresis mild syneresis         Fundus Exam       Right Left   Disc Pink and Sharp, mild PPP Pink and Sharp, mild temporal PPP   C/D Ratio 0.4 0.4   Macula Flat, Good foveal reflex, No heme or edema Flat, Good foveal reflex, RPE mottling, No heme or edema   Vessels mild tortuosity mild tortuosity   Periphery Attached, mild midzonal drusen superiorly, focal pigmented lattice from 1030-1100 and at 0100, No heme, good laser surrounding all lesions, mild pigment cystoid degen inferiorly, no new RT/RD/Lattice Attached, pigmented lattice 1200-0130 and 1100, pigmented lattice with atrophic hole at 0530 -- good laser changes surrounding all lesions, no new RT/RD or lattice           Refraction     Wearing Rx       Sphere Cylinder   Right -0.50 Sphere   Left -1.50 Sphere           IMAGING AND PROCEDURES  Imaging and Procedures for 01/23/2023  OCT, Retina - OU - Both Eyes       Right Eye Quality was good. Central Foveal Thickness: 273. Progression has been stable. Findings include normal foveal contour, no IRF, no SRF, vitreomacular adhesion .   Left Eye Quality was good. Central Foveal Thickness: 279. Progression has been stable. Findings include normal foveal contour, no IRF, no SRF, vitreomacular adhesion .   Notes *Images captured and stored on drive  Diagnosis / Impression:  NFP, no IRF/SRF OU No DME OU  Clinical management:  See below  Abbreviations: NFP - Normal foveal profile. CME - cystoid macular edema. PED - pigment epithelial detachment. IRF - intraretinal fluid. SRF - subretinal fluid. EZ - ellipsoid zone. ERM - epiretinal membrane. ORA - outer retinal atrophy. ORT - outer retinal tubulation. SRHM - subretinal hyper-reflective material. IRHM - intraretinal hyper-reflective material           ASSESSMENT/PLAN:   ICD-10-CM   1. Bilateral retinal lattice degeneration  H35.413 OCT, Retina - OU - Both Eyes    2. Retinal hole of  both eyes  H33.323     3. Diabetes mellitus type 2 without retinopathy (HCC)  E11.9     4. Long term (current) use of oral hypoglycemic drugs  Z79.84     5. Combined forms of age-related cataract of both eyes  H25.813      1,2. Lattice degeneration w/ atrophic holes, both eyes  - OD: focal pigmented lattice at 1100 and 0100 - OS: pigmented lattice ST quad (1200-0100) and at 0530 (with atrophic hole), and at 1100 - discussed findings, prognosis, and treatment options including observation - s/p laser retinopexy OS (08.21.24) -- good laser in place - s/p laser retinopexy OD (09.04.24) -- good laser in place - completed PF QID OU - f/u in 3 months, sooner prn -- DFE/OCT  3,4. Diabetes mellitus, type 2 without retinopathy  - A1C 8.2 (08.16.24), - The incidence, risk factors for progression, natural history and treatment options for diabetic retinopathy  were discussed with patient.   - The need for close monitoring of blood glucose, blood pressure, and serum lipids, avoiding cigarette or any type of tobacco, and the need for long term follow up was also discussed with patient. - f/u in 1 year, sooner prn  5. Mixed Cataract OU - The symptoms of cataract, surgical options, and treatments and risks were discussed with patient. - discussed diagnosis and progression - monitor  Ophthalmic Meds Ordered this visit:  No orders of the defined types were placed in this encounter.    Return in about 3 months (around 04/24/2023) for f/u Lattice w/ holes OU, DFE, OCT.  There are no Patient Instructions on file for this visit.  Explained the diagnoses, plan, and follow up with the patient and they expressed understanding.  Patient expressed understanding of the importance of proper follow up care.   This document serves as a record of services personally performed by Karie Chimera, MD, PhD. It was created on their behalf by Glee Arvin. Manson Passey, OA an ophthalmic technician. The creation of this  record is the provider's dictation and/or activities during the visit.    Electronically signed by: Glee Arvin. Manson Passey, OA 01/24/23 1:23 AM  This document serves as a record of services personally performed by Karie Chimera, MD, PhD. It was created on their behalf by Charlette Caffey, COT an ophthalmic technician. The creation of this record is the provider's dictation and/or activities during the visit.    Electronically signed by:  Charlette Caffey, COT  01/24/23 1:23 AM  Karie Chimera, M.D., Ph.D. Diseases & Surgery of the Retina and Vitreous [facility] 01/23/2023  I have reviewed the above documentation for accuracy and completeness, and I agree with the above. Karie Chimera, M.D., Ph.D. 01/24/23 1:24 AM  Abbreviations: M myopia (nearsighted); A astigmatism; H hyperopia (farsighted); P presbyopia; Mrx spectacle prescription;  CTL contact lenses; OD right eye; OS left eye; OU both eyes  XT exotropia; ET esotropia; PEK punctate epithelial keratitis; PEE punctate epithelial erosions; DES dry eye syndrome; MGD meibomian gland dysfunction; ATs artificial tears; PFAT's preservative free artificial tears; NSC nuclear sclerotic cataract; PSC posterior subcapsular cataract; ERM epi-retinal membrane; PVD posterior vitreous detachment; RD retinal detachment; DM diabetes mellitus; DR diabetic retinopathy; NPDR non-proliferative diabetic retinopathy; PDR proliferative diabetic retinopathy; CSME clinically significant macular edema; DME diabetic macular edema; dbh dot blot hemorrhages; CWS cotton wool spot; POAG primary open angle glaucoma; C/D cup-to-disc ratio; HVF humphrey visual field; GVF goldmann visual field; OCT optical coherence  tomography; IOP intraocular pressure; BRVO Branch retinal vein occlusion; CRVO central retinal vein occlusion; CRAO central retinal artery occlusion; BRAO branch retinal artery occlusion; RT retinal tear; SB scleral buckle; PPV pars plana  vitrectomy; VH Vitreous hemorrhage; PRP panretinal laser photocoagulation; IVK intravitreal kenalog; VMT vitreomacular traction; MH Macular hole;  NVD neovascularization of the disc; NVE neovascularization elsewhere; AREDS age related eye disease study; ARMD age related macular degeneration; POAG primary open angle glaucoma; EBMD epithelial/anterior basement membrane dystrophy; ACIOL anterior chamber intraocular lens; IOL intraocular lens; PCIOL posterior chamber intraocular lens; Phaco/IOL phacoemulsification with intraocular lens placement; PRK photorefractive keratectomy; LASIK laser assisted in situ keratomileusis; HTN hypertension; DM diabetes mellitus; COPD chronic obstructive pulmonary disease

## 2023-01-23 ENCOUNTER — Encounter (INDEPENDENT_AMBULATORY_CARE_PROVIDER_SITE_OTHER): Payer: Self-pay | Admitting: Ophthalmology

## 2023-01-23 ENCOUNTER — Ambulatory Visit (INDEPENDENT_AMBULATORY_CARE_PROVIDER_SITE_OTHER): Payer: 59 | Admitting: Ophthalmology

## 2023-01-23 DIAGNOSIS — H33323 Round hole, bilateral: Secondary | ICD-10-CM | POA: Diagnosis not present

## 2023-01-23 DIAGNOSIS — E119 Type 2 diabetes mellitus without complications: Secondary | ICD-10-CM | POA: Diagnosis not present

## 2023-01-23 DIAGNOSIS — Z7984 Long term (current) use of oral hypoglycemic drugs: Secondary | ICD-10-CM

## 2023-01-23 DIAGNOSIS — H25813 Combined forms of age-related cataract, bilateral: Secondary | ICD-10-CM | POA: Diagnosis not present

## 2023-01-23 DIAGNOSIS — H35413 Lattice degeneration of retina, bilateral: Secondary | ICD-10-CM

## 2023-01-24 ENCOUNTER — Encounter (INDEPENDENT_AMBULATORY_CARE_PROVIDER_SITE_OTHER): Payer: Self-pay | Admitting: Ophthalmology

## 2023-03-17 DIAGNOSIS — B078 Other viral warts: Secondary | ICD-10-CM | POA: Diagnosis not present

## 2023-03-17 DIAGNOSIS — D2262 Melanocytic nevi of left upper limb, including shoulder: Secondary | ICD-10-CM | POA: Diagnosis not present

## 2023-03-17 DIAGNOSIS — L821 Other seborrheic keratosis: Secondary | ICD-10-CM | POA: Diagnosis not present

## 2023-03-17 DIAGNOSIS — D2261 Melanocytic nevi of right upper limb, including shoulder: Secondary | ICD-10-CM | POA: Diagnosis not present

## 2023-03-17 DIAGNOSIS — B079 Viral wart, unspecified: Secondary | ICD-10-CM | POA: Diagnosis not present

## 2023-03-17 DIAGNOSIS — D692 Other nonthrombocytopenic purpura: Secondary | ICD-10-CM | POA: Diagnosis not present

## 2023-03-18 ENCOUNTER — Ambulatory Visit (INDEPENDENT_AMBULATORY_CARE_PROVIDER_SITE_OTHER): Payer: BC Managed Care – PPO | Admitting: Family Medicine

## 2023-03-18 ENCOUNTER — Encounter: Payer: Self-pay | Admitting: Family Medicine

## 2023-03-18 VITALS — BP 110/62 | HR 88 | Temp 98.0°F | Ht 62.0 in | Wt 172.4 lb

## 2023-03-18 DIAGNOSIS — E1169 Type 2 diabetes mellitus with other specified complication: Secondary | ICD-10-CM

## 2023-03-18 DIAGNOSIS — Z7984 Long term (current) use of oral hypoglycemic drugs: Secondary | ICD-10-CM | POA: Diagnosis not present

## 2023-03-18 DIAGNOSIS — E782 Mixed hyperlipidemia: Secondary | ICD-10-CM | POA: Diagnosis not present

## 2023-03-18 DIAGNOSIS — E1165 Type 2 diabetes mellitus with hyperglycemia: Secondary | ICD-10-CM

## 2023-03-18 LAB — COMPREHENSIVE METABOLIC PANEL
ALT: 29 U/L (ref 0–35)
AST: 18 U/L (ref 0–37)
Albumin: 4.7 g/dL (ref 3.5–5.2)
Alkaline Phosphatase: 55 U/L (ref 39–117)
BUN: 15 mg/dL (ref 6–23)
CO2: 26 meq/L (ref 19–32)
Calcium: 9.6 mg/dL (ref 8.4–10.5)
Chloride: 102 meq/L (ref 96–112)
Creatinine, Ser: 0.75 mg/dL (ref 0.40–1.20)
GFR: 89.21 mL/min (ref >60.00)
Glucose, Bld: 180 mg/dL — ABNORMAL HIGH (ref 70–99)
Potassium: 4.3 meq/L (ref 3.5–5.1)
Sodium: 137 meq/L (ref 135–145)
Total Bilirubin: 0.5 mg/dL (ref 0.2–1.2)
Total Protein: 7.6 g/dL (ref 6.0–8.3)

## 2023-03-18 LAB — LIPID PANEL
Cholesterol: 174 mg/dL (ref 0–200)
HDL: 58.8 mg/dL (ref >39.00)
LDL Cholesterol: 78 mg/dL (ref 0–99)
NonHDL: 114.92
Total CHOL/HDL Ratio: 3
Triglycerides: 186 mg/dL — ABNORMAL HIGH (ref 0.0–149.0)
VLDL: 37.2 mg/dL (ref 0.0–40.0)

## 2023-03-18 LAB — POCT GLYCOSYLATED HEMOGLOBIN (HGB A1C): Hemoglobin A1C: 7.8 % — AB (ref 4.0–5.6)

## 2023-03-18 MED ORDER — METFORMIN HCL ER 750 MG PO TB24: 1500.0000 mg | ORAL_TABLET | Freq: Every day | ORAL | 3 refills | Status: DC

## 2023-03-18 NOTE — Progress Notes (Signed)
Subjective  CC:  Chief Complaint  Patient presents with   Diabetes    HPI: Jacqueline Rice is a 57 y.o. female who presents to the office today for follow up of diabetes and problems listed above in the chief complaint.  Diabetes follow up: Her diabetic control is reported as Unchanged. Diet is stable. Weight is stable.  She denies exertional CP or SOB or symptomatic hypoglycemia. She denies foot sores or paresthesias.  HLD started crestor 10 3 months ago and tolerating well.   Wt Readings from Last 3 Encounters:  03/18/23 172 lb 6.4 oz (78.2 kg)  12/13/22 169 lb 3.2 oz (76.7 kg)  05/01/22 179 lb 6.4 oz (81.4 kg)    BP Readings from Last 3 Encounters:  03/18/23 110/62  12/13/22 120/72  05/01/22 100/64    Assessment  1. Uncontrolled type 2 diabetes mellitus with hyperglycemia (HCC)   2. Combined hyperlipidemia associated with type 2 diabetes mellitus (HCC)      Plan  Diabetes is currently poorly controlled. Will increase met xr 1500mg  daily. Pt defers adding 2nd med at this time. Work on diet and recheck in 3 months.  Add SGLT2 at that time if not at goal.  Discussed goal.  See AVS for nutrition recommendations. Hyperlipidemia now on Crestor 10 mg nightly.  Recheck fasting lipids and LFTs. She is overdue for Pap smear after review.  Defers today but will have Pap smear in 3 months.  Follow up: 3 months to recheck diabetes and Pap smear Orders Placed This Encounter  Procedures   Lipid panel   Comprehensive metabolic panel   POCT HgB A1C   Meds ordered this encounter  Medications   metFORMIN (GLUCOPHAGE-XR) 750 MG 24 hr tablet    Sig: Take 2 tablets (1,500 mg total) by mouth daily with breakfast.    Dispense:  180 tablet    Refill:  3      Immunization History  Administered Date(s) Administered   Influenza Inj Mdck Quad Pf 02/14/2018   Influenza, Seasonal, Injecte, Preservative Fre 01/31/2023   Influenza,inj,Quad PF,6+ Mos 02/02/2017, 02/04/2019, 02/24/2020,  02/15/2021, 03/13/2022   Moderna Covid-19 Fall Seasonal Vaccine 31yrs & older 01/31/2023   PFIZER Comirnaty(Gray Top)Covid-19 Tri-Sucrose Vaccine 08/19/2020   PFIZER(Purple Top)SARS-COV-2 Vaccination 07/04/2019, 07/25/2019, 03/11/2020   Tdap 09/30/2017   Zoster Recombinant(Shingrix) 08/17/2019, 10/27/2019    Diabetes Related Lab Review: Lab Results  Component Value Date   HGBA1C 7.8 (A) 03/18/2023   HGBA1C 7.6 (A) 12/13/2022   HGBA1C 8.2 (H) 12/13/2022    Lab Results  Component Value Date   MICROALBUR 1.0 12/13/2022   Lab Results  Component Value Date   CREATININE 0.78 12/13/2022   BUN 14 12/13/2022   NA 132 (L) 12/13/2022   K 3.8 12/13/2022   CL 98 12/13/2022   CO2 27 12/13/2022   Lab Results  Component Value Date   CHOL 271 (H) 12/13/2022   CHOL 278 (H) 12/12/2021   CHOL 191 10/04/2020   Lab Results  Component Value Date   HDL 50.90 12/13/2022   HDL 52.30 12/12/2021   HDL 57.30 10/04/2020   Lab Results  Component Value Date   LDLCALC 181 (H) 12/13/2022   LDLCALC 134 (H) 10/04/2019   LDLCALC 118 (H) 10/02/2018   Lab Results  Component Value Date   TRIG 195.0 (H) 12/13/2022   TRIG 341.0 (H) 12/12/2021   TRIG 213.0 (H) 10/04/2020   Lab Results  Component Value Date   CHOLHDL 5 12/13/2022  CHOLHDL 5 12/12/2021   CHOLHDL 3 10/04/2020   Lab Results  Component Value Date   LDLDIRECT 183.0 12/12/2021   LDLDIRECT 110.0 10/04/2020   The 10-year ASCVD risk score (Arnett DK, et al., 2019) is: 4.5%   Values used to calculate the score:     Age: 89 years     Sex: Female     Is Non-Hispanic African American: No     Diabetic: Yes     Tobacco smoker: No     Systolic Blood Pressure: 110 mmHg     Is BP treated: No     HDL Cholesterol: 50.9 mg/dL     Total Cholesterol: 271 mg/dL I have reviewed the PMH, Fam and Soc history. Patient Active Problem List   Diagnosis Date Noted Date Diagnosed   Class 1 obesity due to excess calories with serious comorbidity and  body mass index (BMI) of 30.0 to 30.9 in adult 12/13/2022     Priority: High   Uncontrolled type 2 diabetes mellitus with hyperglycemia (HCC) 05/01/2022     Priority: High    A1c 6.29 September 2020, 7.27 November 2021, 7.04 May 2022: 1st visit for diabetes.  Met xr 750 daily, increased to 1500mg  daily 02/2023; pt defers 2nd med at this time.     Combined hyperlipidemia associated with type 2 diabetes mellitus (HCC) 05/01/2022     Priority: High    Crestor 10 11/2022    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 11/16/2020     Priority: High   Migraine without aura 06/01/2020     Priority: High   Postherpetic neuralgia 06/01/2020     Priority: High   Vitamin D deficiency 10/07/2016     Priority: Low    Social History: Patient  reports that she has never smoked. She has never used smokeless tobacco. She reports that she does not drink alcohol and does not use drugs.  Review of Systems: Ophthalmic: negative for eye pain, loss of vision or double vision Cardiovascular: negative for chest pain Respiratory: negative for SOB or persistent cough Gastrointestinal: negative for abdominal pain Genitourinary: negative for dysuria or gross hematuria MSK: negative for foot lesions Neurologic: negative for weakness or gait disturbance  Objective  Vitals: BP 110/62   Pulse 88   Temp 98 F (36.7 C)   Ht 5\' 2"  (1.575 m)   Wt 172 lb 6.4 oz (78.2 kg)   SpO2 95%   BMI 31.53 kg/m  General: well appearing, no acute distress  Psych:  Alert and oriented, normal mood and affect  Diabetic education: ongoing education regarding chronic disease management for diabetes was given today. We continue to reinforce the ABC's of diabetic management: A1c (<7 or 8 dependent upon patient), tight blood pressure control, and cholesterol management with goal LDL < 100 minimally. We discuss diet strategies, exercise recommendations, medication options and possible side  effects. At each visit, we review recommended immunizations and preventive care recommendations for diabetics and stress that good diabetic control can prevent other problems. See below for this patient's data.   Commons side effects, risks, benefits, and alternatives for medications and treatment plan prescribed today were discussed, and the patient expressed understanding of the given instructions. Patient is instructed to call or message via MyChart if he/she has any questions or concerns regarding our treatment plan. No barriers to understanding were identified. We discussed Red Flag symptoms and signs in detail. Patient expressed understanding regarding what to  do in case of urgent or emergency type symptoms.  Medication list was reconciled, printed and provided to the patient in AVS. Patient instructions and summary information was reviewed with the patient as documented in the AVS. This note was prepared with assistance of Dragon voice recognition software. Occasional wrong-word or sound-a-like substitutions may have occurred due to the inherent limitations of voice recognition software

## 2023-03-18 NOTE — Patient Instructions (Addendum)
Please return in 3 months for diabetes follow up And Pap smear  If you have any questions or concerns, please don't hesitate to send me a message via MyChart or call the office at [phone]. Thank you for visiting with [place] today! It's our pleasure caring for you.   Diabetes Mellitus and Nutrition, Adult When you have diabetes, or diabetes mellitus, it is very important to have healthy eating habits because your blood sugar (glucose) levels are greatly affected by what you eat and drink. Eating healthy foods in the right amounts, at about the same times every day, can help you: Manage your blood glucose. Lower your risk of heart disease. Improve your blood pressure. Reach or maintain a healthy weight. What can affect my meal plan? Every person with diabetes is different, and each person has different needs for a meal plan. Your health care provider may recommend that you work with a dietitian to make a meal plan that is best for you. Your meal plan may vary depending on factors such as: The calories you need. The medicines you take. Your weight. Your blood glucose, blood pressure, and cholesterol levels. Your activity level. Other health conditions you have, such as heart or kidney disease. How do carbohydrates affect me? Carbohydrates, also called carbs, affect your blood glucose level more than any other type of food. Eating carbs raises the amount of glucose in your blood. It is important to know how many carbs you can safely have in each meal. This is different for every person. Your dietitian can help you calculate how many carbs you should have at each meal and for each snack. How does alcohol affect me? Alcohol can cause a decrease in blood glucose (hypoglycemia), especially if you use insulin or take certain diabetes medicines by mouth. Hypoglycemia can be a life-threatening condition. Symptoms of hypoglycemia, such as sleepiness, dizziness, and confusion, are similar to symptoms of  having too much alcohol. Do not drink alcohol if: Your health care provider tells you not to drink. You are pregnant, may be pregnant, or are planning to become pregnant. If you drink alcohol: Limit how much you have to: 0-1 drink a day for women. 0-2 drinks a day for men. Know how much alcohol is in your drink. In the [place] one drink equals one 12 oz bottle of beer (355 mL), one 5 oz glass of wine (148 mL), or one 1 oz glass of hard liquor (44 mL). Keep yourself hydrated with water, diet soda, or unsweetened iced tea. Keep in mind that regular soda, juice, and other mixers may contain a lot of sugar and must be counted as carbs. What are tips for following this plan?  Reading food labels Start by checking the serving size on the Nutrition Facts label of packaged foods and drinks. The number of calories and the amount of carbs, fats, and other nutrients listed on the label are based on one serving of the item. Many items contain more than one serving per package. Check the total grams (g) of carbs in one serving. Check the number of grams of saturated fats and trans fats in one serving. Choose foods that have a low amount or none of these fats. Check the number of milligrams (mg) of salt (sodium) in one serving. Most people should limit total sodium intake to less than 2,300 mg per day. Always check the nutrition information of foods labeled as "low-fat" or "nonfat." These foods may be higher in added sugar or refined carbs and  should be avoided. Talk to your dietitian to identify your daily goals for nutrients listed on the label. Shopping Avoid buying canned, pre-made, or processed foods. These foods tend to be high in fat, sodium, and added sugar. Shop around the outside edge of the grocery store. This is where you will most often find fresh fruits and vegetables, bulk grains, fresh meats, and fresh dairy products. Cooking Use low-heat cooking methods, such as baking, instead of high-heat  cooking methods, such as deep frying. Cook using healthy oils, such as olive, canola, or sunflower oil. Avoid cooking with butter, cream, or high-fat meats. Meal planning Eat meals and snacks regularly, preferably at the same times every day. Avoid going long periods of time without eating. Eat foods that are high in fiber, such as fresh fruits, vegetables, beans, and whole grains. Eat 4-6 oz (112-168 g) of lean protein each day, such as lean meat, chicken, fish, eggs, or tofu. One ounce (oz) (28 g) of lean protein is equal to: 1 oz (28 g) of meat, chicken, or fish. 1 egg.  cup (62 g) of tofu. Eat some foods each day that contain healthy fats, such as avocado, nuts, seeds, and fish. What foods should I eat? Fruits Berries. Apples. Oranges. Peaches. Apricots. Plums. Grapes. Mangoes. Papayas. Pomegranates. Kiwi. Cherries. Vegetables Leafy greens, including lettuce, spinach, kale, chard, collard greens, mustard greens, and cabbage. Beets. Cauliflower. Broccoli. Carrots. Green beans. Tomatoes. Peppers. Onions. Cucumbers. Brussels sprouts. Grains Whole grains, such as whole-wheat or whole-grain bread, crackers, tortillas, cereal, and pasta. Unsweetened oatmeal. Quinoa. Brown or wild rice. Meats and other proteins Seafood. Poultry without skin. Lean cuts of poultry and beef. Tofu. Nuts. Seeds. Dairy Low-fat or fat-free dairy products such as milk, yogurt, and cheese. The items listed above may not be a complete list of foods and beverages you can eat and drink. Contact a dietitian for more information. What foods should I avoid? Fruits Fruits canned with syrup. Vegetables Canned vegetables. Frozen vegetables with butter or cream sauce. Grains Refined white flour and flour products such as bread, pasta, snack foods, and cereals. Avoid all processed foods. Meats and other proteins Fatty cuts of meat. Poultry with skin. Breaded or fried meats. Processed meat. Avoid saturated  fats. Dairy Full-fat yogurt, cheese, or milk. Beverages Sweetened drinks, such as soda or iced tea. The items listed above may not be a complete list of foods and beverages you should avoid. Contact a dietitian for more information. Questions to ask a health care provider Do I need to meet with a certified diabetes care and education specialist? Do I need to meet with a dietitian? What number can I call if I have questions? When are the best times to check my blood glucose? Where to find more information: American Diabetes Association: diabetes.org Academy of Nutrition and Dietetics: eatright.[employer] of Diabetes and Digestive and Kidney Diseases: StageSync.si Association of Diabetes Care & Education Specialists: diabeteseducator.org Summary It is important to have healthy eating habits because your blood sugar (glucose) levels are greatly affected by what you eat and drink. It is important to use alcohol carefully. A healthy meal plan will help you manage your blood glucose and lower your risk of heart disease. Your health care provider may recommend that you work with a dietitian to make a meal plan that is best for you. This information is not intended to replace advice given to you by your health care provider. Make sure you discuss any questions you have with your health  care provider. Document Revised: 11/17/2019 Document Reviewed: 11/17/2019 Elsevier Patient Education  2024 [employer].

## 2023-03-20 NOTE — Progress Notes (Signed)
See mychart note Dear Jacqueline Rice, Randie Heinz news: Your cholesterol medicine is working extremely well.  Your LDL is now near goal at 78.  Continue taking your medication nightly and eating a low-fat diabetic diet.  We will work on getting her diabetes under control now.  See you in 3 months.  Happy holidays  Sincerely, Dr. Mardelle Matte

## 2023-04-07 IMAGING — MG MM DIGITAL SCREENING BILAT W/ TOMO AND CAD
8 series · 9 of 24 positions shown · non-contrast
Comparison: Previous exam(s).

CLINICAL DATA: Screening.

EXAM:
DIGITAL SCREENING BILATERAL MAMMOGRAM WITH TOMOSYNTHESIS AND CAD
TECHNIQUE: Bilateral screening digital craniocaudal and mediolateral oblique
mammograms were obtained. Bilateral screening digital breast
tomosynthesis was performed. The images were evaluated with
computer-aided detection.

[R CC synth-2D]
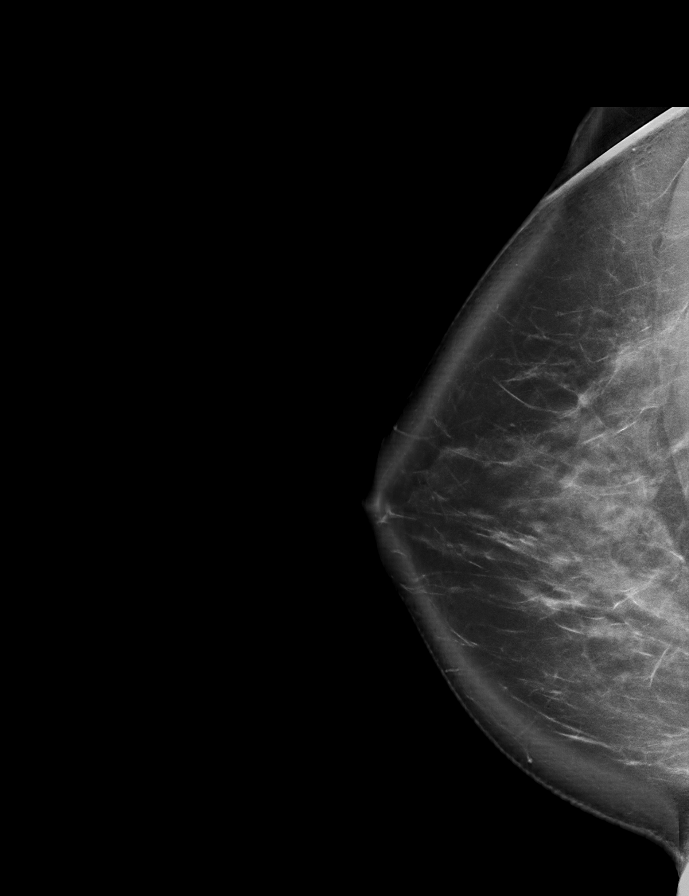

[R MLO synth-2D]
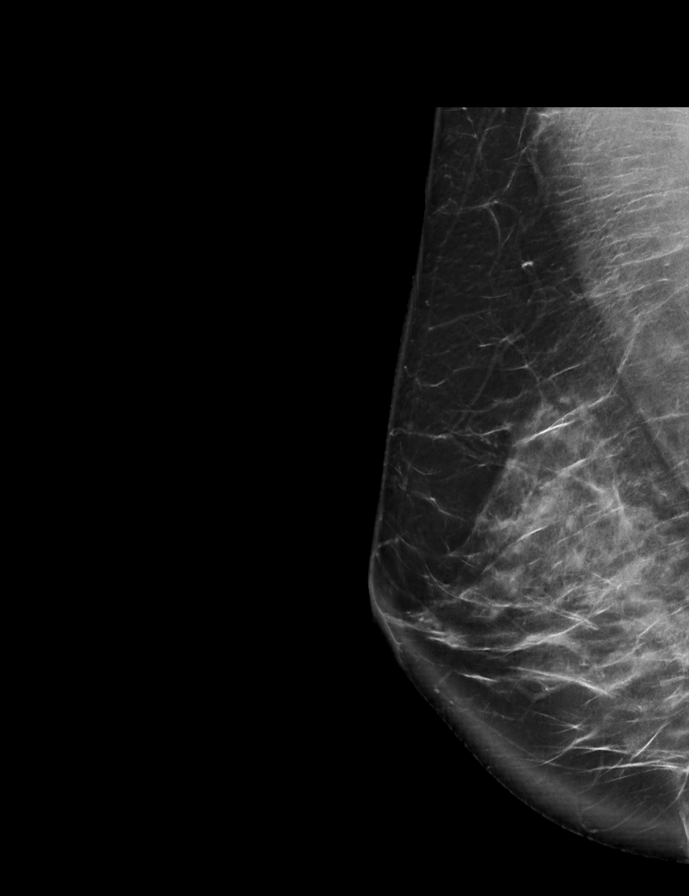

[L MLO synth-2D]
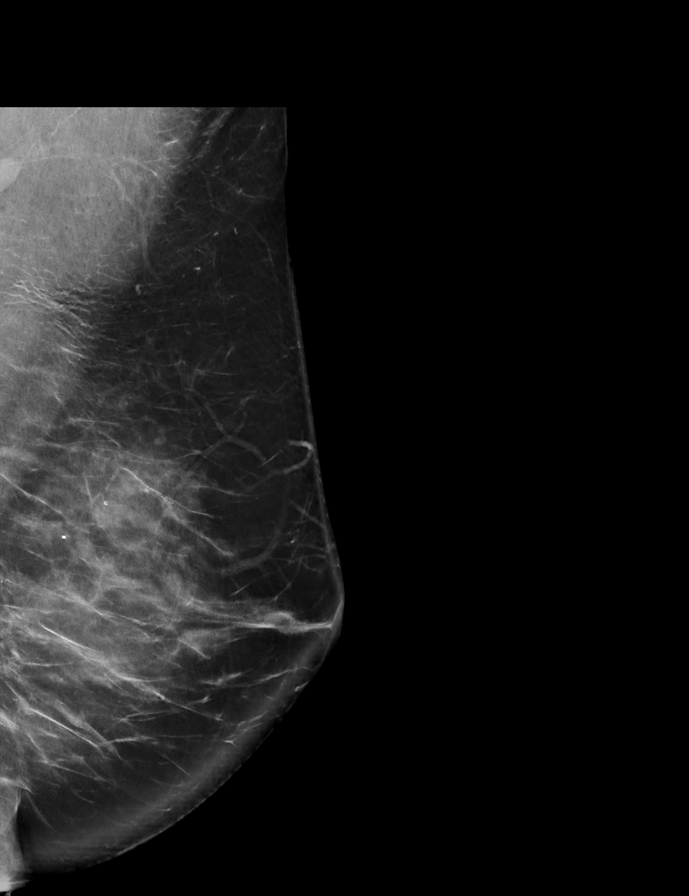

[L CC synth-2D]
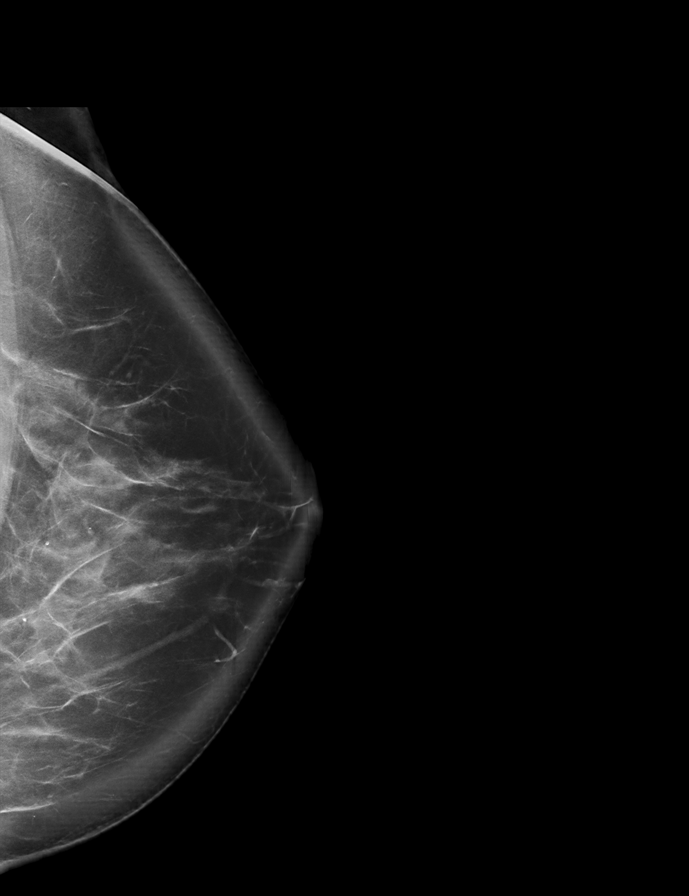

[L CC tomo · 2 of 108 frames shown]
[frame 35/108]
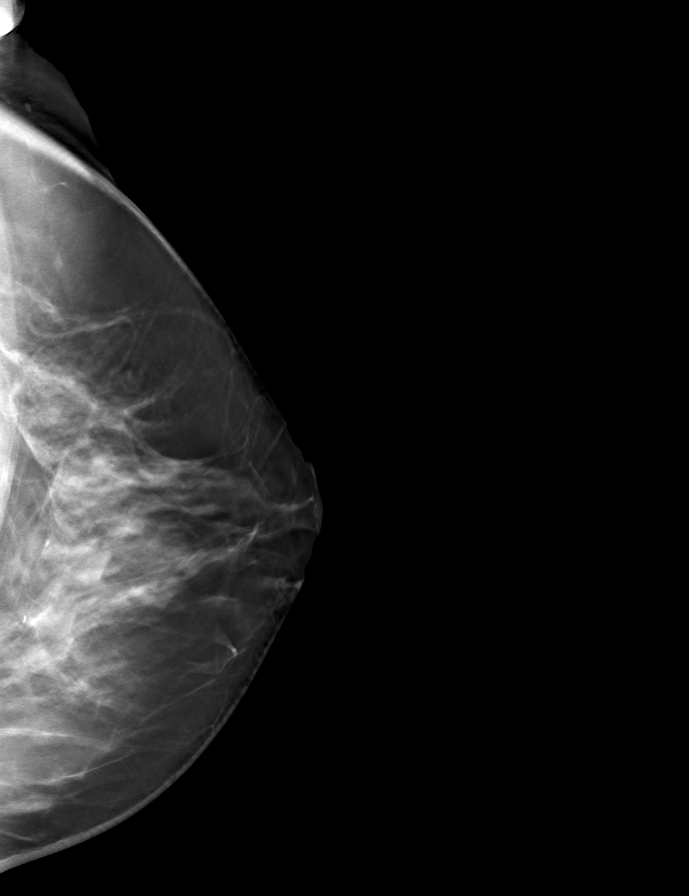
[frame 55/108]
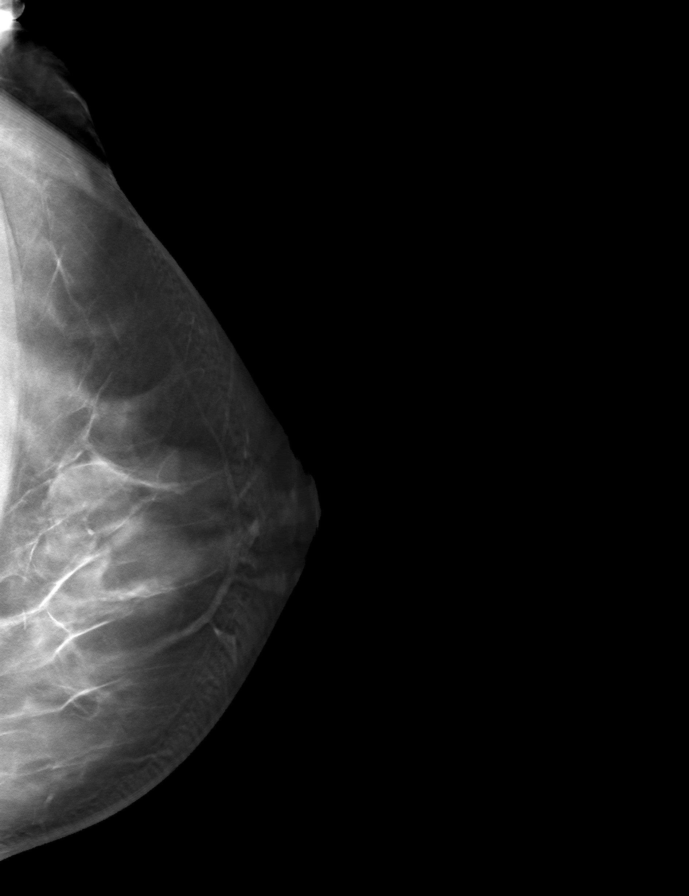

[L MLO tomo · tomo slice 49/96.0]
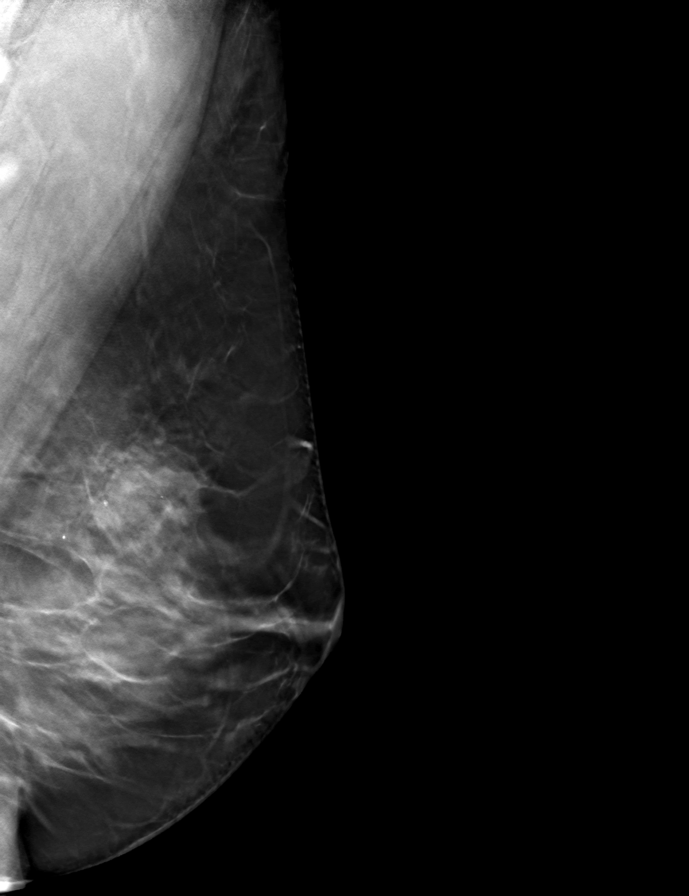

[R MLO tomo · tomo slice 49/96.0]
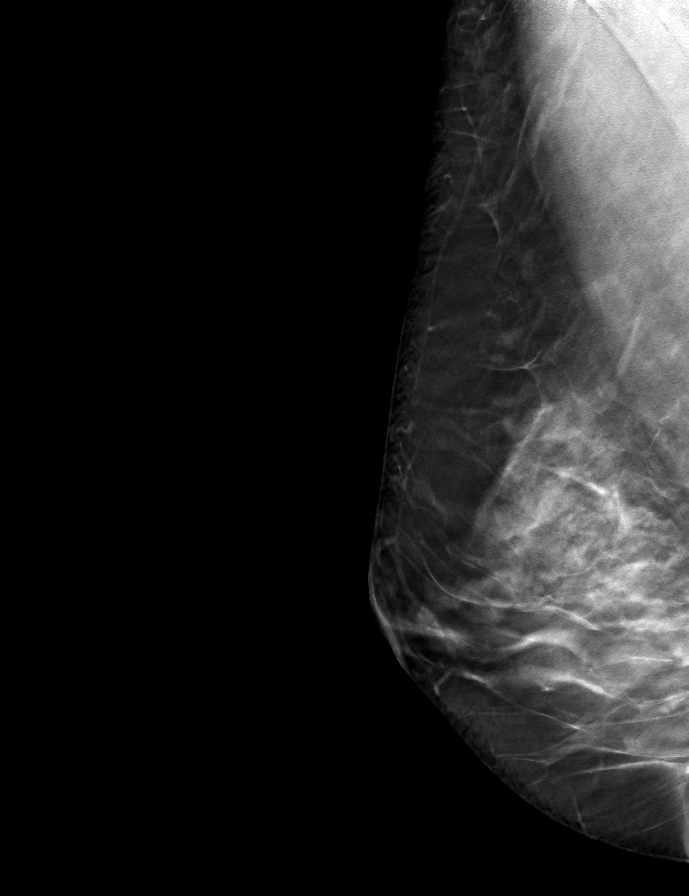

[R CC tomo · tomo slice 54/107.0]
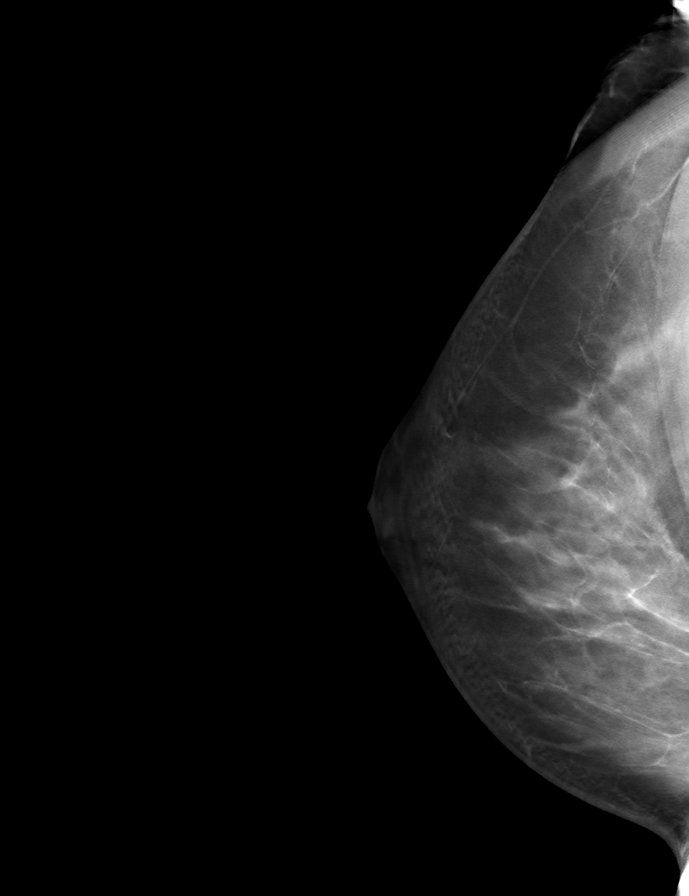

[9 of 24 positions shown; findings below may reference images not displayed]

ACR Breast Density Category c: The breast tissue is heterogeneously
dense, which may obscure small masses.
FINDINGS: There are no findings suspicious for malignancy.
IMPRESSION: No mammographic evidence of malignancy. A result letter of this
screening mammogram will be mailed directly to the patient.

RECOMMENDATION:
Screening mammogram in one year. (Code:[id number])

BI-RADS CATEGORY  1: Negative.

## 2023-05-01 NOTE — Progress Notes (Signed)
 [facility] - Clinic Note  05/02/2023   CHIEF COMPLAINT Patient presents for Retina Follow Up  HISTORY OF PRESENT ILLNESS: Jacqueline Rice is a 58 y.o. female who presents to the clinic today for:  HPI     Retina Follow Up   In both eyes.  This started 3 months ago.  Duration of 3 months.  Since onset it is stable.  I, the attending physician,  performed the HPI with the patient and updated documentation appropriately.        Comments   3 month retina follow up lattice deg with holes pt is reporting no vision changes noticed she has some floaters but denies any flashes       Last edited by Valdemar Rogue, MD on 05/02/2023  4:38 PM.     Patient states she is not seeing any new fol or floaters, she has an appt with Dr. Octavia coming up  Referring physician: Octavia Charlie Hamilton, MD [address] [place],  [place] [address]  HISTORICAL INFORMATION:  Selected notes from the MEDICAL RECORD NUMBER Referred by Dr. Hamilton Octavia for atrophic retinal hole OS LEE:  Ocular Hx- PMH-   CURRENT MEDICATIONS: No current outpatient medications on file. (Ophthalmic Drugs)   No current facility-administered medications for this visit. (Ophthalmic Drugs)   Current Outpatient Medications (Other)  Medication Sig   Ascorbic Acid (VITAMIN C PO) Take by mouth.   cetirizine (ZYRTEC) 10 MG tablet Take 10 mg by mouth daily.   fluticasone (FLONASE) 50 MCG/ACT nasal spray Place into both nostrils daily.   metFORMIN  (GLUCOPHAGE -XR) 750 MG 24 hr tablet Take 2 tablets (1,500 mg total) by mouth daily with breakfast.   Multiple Vitamin (MULTIVITAMIN ADULT PO) Take by mouth. OTC   rosuvastatin  (CRESTOR ) 10 MG tablet Take 10 mg by mouth daily.   VITAMIN D , ERGOCALCIFEROL , PO Take by mouth.   Zinc 10 MG LOZG Use as directed in the mouth or throat.   No current facility-administered medications for this visit. (Other)   REVIEW OF SYSTEMS: ROS   Positive for: Eyes Last edited by  Resa Delon ORN, COT on 05/02/2023  9:28 AM.        ALLERGIES Allergies  Allergen Reactions   Amoxicillin Nausea And Vomiting   Penicillins Nausea And Vomiting   PAST MEDICAL HISTORY Past Medical History:  Diagnosis Date   Allergy    Gestational diabetes 2005   Vitamin D  deficiency 10/07/2016   Past Surgical History:  Procedure Laterality Date   CHOLECYSTECTOMY     OVARIAN CYST REMOVAL  20 years ago   FAMILY HISTORY Family History  Problem Relation Age of Onset   Diabetes Mother    Mental illness Mother    High blood pressure Father    High Cholesterol Father    Mental illness Brother    Breast cancer Paternal Aunt    Healthy Daughter    Healthy Son    Colon cancer Neg Hx    Esophageal cancer Neg Hx    Rectal cancer Neg Hx    Stomach cancer Neg Hx    SOCIAL HISTORY Social History   Tobacco Use   Smoking status: Never   Smokeless tobacco: Never  Vaping Use   Vaping status: Never Used  Substance Use Topics   Alcohol use: No   Drug use: No       OPHTHALMIC EXAM:  Base Eye Exam     Visual Acuity (Snellen - Linear)  Right Left   Dist cc 20/20 20/20         Tonometry (Tonopen, 9:32 AM)       Right Left   Pressure 10 12         Pupils       Pupils Dark Light Shape React APD   Right PERRL 4 3 Round Brisk None   Left PERRL 4 3 Round Brisk None         Visual Fields       Left Right    Full Full         Extraocular Movement       Right Left    Full, Ortho Full, Ortho         Neuro/Psych     Oriented x3: Yes   Mood/Affect: Normal         Dilation     Both eyes: 2.5% Phenylephrine @ 9:32 AM           Slit Lamp and Fundus Exam     Slit Lamp Exam       Right Left   Lids/Lashes Dermatochalasis - upper lid Dermatochalasis - upper lid   Conjunctiva/Sclera White and quiet White and quiet   Cornea Clear Clear   Anterior Chamber deep and clear deep and clear   Iris Round and dilated Round and dilated    Lens 2+ Nuclear sclerosis, 2+ Cortical cataract 2+ Nuclear sclerosis, 2+ Cortical cataract   Anterior Vitreous mild syneresis mild syneresis         Fundus Exam       Right Left   Disc Pink and Sharp, mild PPP Pink and Sharp, mild temporal PPP   C/D Ratio 0.4 0.4   Macula Flat, Good foveal reflex, No heme or edema Flat, Good foveal reflex, RPE mottling, No heme or edema   Vessels mild tortuosity mild tortuosity   Periphery Attached, mild midzonal drusen superiorly, focal pigmented lattice from 1030-1100 and at 0100, No heme, good laser surrounding all lesions, mild pigment cystoid degen inferiorly, no new RT/RD/Lattice Attached, pigmented lattice from 1200-0130 and 1100, pigmented lattice with atrophic hole at 0530 -- good laser changes surrounding all lesions, no new RT/RD or lattice           Refraction     Wearing Rx       Sphere Cylinder   Right -0.50 Sphere   Left -1.50 Sphere           IMAGING AND PROCEDURES  Imaging and Procedures for 05/02/2023  OCT, Retina - OU - Both Eyes       Right Eye Quality was good. Central Foveal Thickness: 273. Progression has been stable. Findings include normal foveal contour, no IRF, no SRF, vitreomacular adhesion .   Left Eye Quality was good. Central Foveal Thickness: 279. Progression has been stable. Findings include normal foveal contour, no IRF, no SRF, vitreomacular adhesion .   Notes *Images captured and stored on drive  Diagnosis / Impression:  NFP, no IRF/SRF OU No DME OU  Clinical management:  See below  Abbreviations: NFP - Normal foveal profile. CME - cystoid macular edema. PED - pigment epithelial detachment. IRF - intraretinal fluid. SRF - subretinal fluid. EZ - ellipsoid zone. ERM - epiretinal membrane. ORA - outer retinal atrophy. ORT - outer retinal tubulation. SRHM - subretinal hyper-reflective material. IRHM - intraretinal hyper-reflective material            ASSESSMENT/PLAN:   ICD-10-CM   1.  Bilateral retinal lattice degeneration  H35.413 OCT, Retina - OU - Both Eyes    2. Retinal hole of both eyes  H33.323     3. Diabetes mellitus type 2 without retinopathy (HCC)  E11.9 OCT, Retina - OU - Both Eyes    4. Long term (current) use of oral hypoglycemic drugs  Z79.84     5. Combined forms of age-related cataract of both eyes  H25.813       1,2. Lattice degeneration w/ atrophic holes, both eyes  - OD: focal pigmented lattice at 1100 and 0100 - OS: pigmented lattice ST quad (1200-0100) and at 0530 (with atrophic hole), and at 1100 - s/p laser retinopexy OS (08.21.24) -- good laser in place - s/p laser retinopexy OD (09.04.24) -- good laser in place - pt is cleared from a retina standpoint for release to Dr. Glendia Gaudy and resumption of primary eye care  3,4. Diabetes mellitus, type 2 without retinopathy  - A1C 8.2 (08.16.24), - The incidence, risk factors for progression, natural history and treatment options for diabetic retinopathy  were discussed with patient.   - The need for close monitoring of blood glucose, blood pressure, and serum lipids, avoiding cigarette or any type of tobacco, and the need for long term follow up was also discussed with patient. - under expert watch of Dr. Gaudy  5. Mixed Cataract OU - The symptoms of cataract, surgical options, and treatments and risks were discussed with patient. - discussed diagnosis and progression - monitor  Ophthalmic Meds Ordered this visit:  No orders of the defined types were placed in this encounter.    Return if symptoms worsen or fail to improve.  There are no Patient Instructions on file for this visit.  Explained the diagnoses, plan, and follow up with the patient and they expressed understanding.  Patient expressed understanding of the importance of proper follow up care.   This document serves as a record of services personally performed by Redell JUDITHANN Hans, MD, PhD. It was created on their behalf by  Wanda GEANNIE Keens, COT an ophthalmic technician. The creation of this record is the provider's dictation and/or activities during the visit.    Electronically signed by:  Wanda GEANNIE Keens, COT  05/02/23 4:40 PM  This document serves as a record of services personally performed by Redell JUDITHANN Hans, MD, PhD. It was created on their behalf by Alan PARAS. Delores, OA an ophthalmic technician. The creation of this record is the provider's dictation and/or activities during the visit.    Electronically signed by: Alan PARAS. Delores, OA 05/02/23 4:40 PM   Redell JUDITHANN Hans, M.D., Ph.D. Diseases & Surgery of the Retina and Vitreous [facility] 05/02/2023  I have reviewed the above documentation for accuracy and completeness, and I agree with the above. Redell JUDITHANN Hans, M.D., Ph.D. 05/02/23 4:41 PM  Abbreviations: M myopia (nearsighted); A astigmatism; H hyperopia (farsighted); P presbyopia; Mrx spectacle prescription;  CTL contact lenses; OD right eye; OS left eye; OU both eyes  XT exotropia; ET esotropia; PEK punctate epithelial keratitis; PEE punctate epithelial erosions; DES dry eye syndrome; MGD meibomian gland dysfunction; ATs artificial tears; PFAT's preservative free artificial tears; NSC nuclear sclerotic cataract; PSC posterior subcapsular cataract; ERM epi-retinal membrane; PVD posterior vitreous detachment; RD retinal detachment; DM diabetes mellitus; DR diabetic retinopathy; NPDR non-proliferative diabetic retinopathy; PDR proliferative diabetic retinopathy; CSME clinically significant macular edema; DME diabetic macular edema; dbh dot blot hemorrhages; CWS cotton wool spot; POAG  primary open angle glaucoma; C/D cup-to-disc ratio; HVF humphrey visual field; GVF goldmann visual field; OCT optical coherence tomography; IOP intraocular pressure; BRVO Branch retinal vein occlusion; CRVO central retinal vein occlusion; CRAO central retinal artery occlusion; BRAO branch retinal artery  occlusion; RT retinal tear; SB scleral buckle; PPV pars plana vitrectomy; VH Vitreous hemorrhage; PRP panretinal laser photocoagulation; IVK intravitreal kenalog; VMT vitreomacular traction; MH Macular hole;  NVD neovascularization of the disc; NVE neovascularization elsewhere; AREDS age related eye disease study; ARMD age related macular degeneration; POAG primary open angle glaucoma; EBMD epithelial/anterior basement membrane dystrophy; ACIOL anterior chamber intraocular lens; IOL intraocular lens; PCIOL posterior chamber intraocular lens; Phaco/IOL phacoemulsification with intraocular lens placement; PRK photorefractive keratectomy; LASIK laser assisted in situ keratomileusis; HTN hypertension; DM diabetes mellitus; COPD chronic obstructive pulmonary disease

## 2023-05-02 ENCOUNTER — Encounter (INDEPENDENT_AMBULATORY_CARE_PROVIDER_SITE_OTHER): Payer: Self-pay | Admitting: Ophthalmology

## 2023-05-02 ENCOUNTER — Ambulatory Visit (INDEPENDENT_AMBULATORY_CARE_PROVIDER_SITE_OTHER): Payer: BC Managed Care – PPO | Admitting: Ophthalmology

## 2023-05-02 DIAGNOSIS — H25813 Combined forms of age-related cataract, bilateral: Secondary | ICD-10-CM | POA: Diagnosis not present

## 2023-05-02 DIAGNOSIS — H33323 Round hole, bilateral: Secondary | ICD-10-CM | POA: Diagnosis not present

## 2023-05-02 DIAGNOSIS — H35413 Lattice degeneration of retina, bilateral: Secondary | ICD-10-CM

## 2023-05-02 DIAGNOSIS — Z7984 Long term (current) use of oral hypoglycemic drugs: Secondary | ICD-10-CM | POA: Diagnosis not present

## 2023-05-02 DIAGNOSIS — E119 Type 2 diabetes mellitus without complications: Secondary | ICD-10-CM | POA: Diagnosis not present

## 2023-05-30 ENCOUNTER — Other Ambulatory Visit: Payer: Self-pay | Admitting: Family Medicine

## 2023-05-30 DIAGNOSIS — Z1231 Encounter for screening mammogram for malignant neoplasm of breast: Secondary | ICD-10-CM

## 2023-06-04 ENCOUNTER — Other Ambulatory Visit: Payer: Self-pay | Admitting: Family Medicine

## 2023-06-04 ENCOUNTER — Other Ambulatory Visit (INDEPENDENT_AMBULATORY_CARE_PROVIDER_SITE_OTHER): Payer: Self-pay | Admitting: Ophthalmology

## 2023-06-13 ENCOUNTER — Ambulatory Visit
Admission: RE | Admit: 2023-06-13 | Discharge: 2023-06-13 | Disposition: A | Discharge status: Home / Self Care | Payer: BC Managed Care – PPO | Source: Ambulatory Visit | Attending: Family Medicine | Admitting: Family Medicine

## 2023-06-13 DIAGNOSIS — Z1231 Encounter for screening mammogram for malignant neoplasm of breast: Secondary | ICD-10-CM

## 2023-06-18 ENCOUNTER — Ambulatory Visit: Payer: BC Managed Care – PPO | Admitting: Family Medicine

## 2023-06-23 ENCOUNTER — Encounter: Payer: Self-pay | Admitting: Family Medicine

## 2023-06-23 ENCOUNTER — Ambulatory Visit: Payer: BC Managed Care – PPO | Admitting: Family Medicine

## 2023-06-23 VITALS — BP 113/75 | HR 78 | Temp 97.7°F | Ht 62.0 in | Wt 167.4 lb

## 2023-06-23 DIAGNOSIS — E1165 Type 2 diabetes mellitus with hyperglycemia: Secondary | ICD-10-CM

## 2023-06-23 DIAGNOSIS — Z7984 Long term (current) use of oral hypoglycemic drugs: Secondary | ICD-10-CM

## 2023-06-23 DIAGNOSIS — Z6830 Body mass index (BMI) 30.0-30.9, adult: Secondary | ICD-10-CM

## 2023-06-23 DIAGNOSIS — E66811 Obesity, class 1: Secondary | ICD-10-CM | POA: Diagnosis not present

## 2023-06-23 DIAGNOSIS — Z23 Encounter for immunization: Secondary | ICD-10-CM

## 2023-06-23 DIAGNOSIS — G35 Multiple sclerosis: Secondary | ICD-10-CM

## 2023-06-23 DIAGNOSIS — H3323 Serous retinal detachment, bilateral: Secondary | ICD-10-CM

## 2023-06-23 DIAGNOSIS — E6609 Other obesity due to excess calories: Secondary | ICD-10-CM

## 2023-06-23 LAB — POCT GLYCOSYLATED HEMOGLOBIN (HGB A1C): Hemoglobin A1C: 8.1 % — AB (ref 4.0–5.6)

## 2023-06-23 MED ORDER — METFORMIN HCL ER 750 MG PO TB24: 1500.0000 mg | ORAL_TABLET | Freq: Every day | ORAL | 3 refills | Status: AC

## 2023-06-23 MED ORDER — DAPAGLIFLOZIN PROPANEDIOL 10 MG PO TABS: 10.0000 mg | ORAL_TABLET | Freq: Every day | ORAL | 3 refills | Status: AC

## 2023-06-23 NOTE — Progress Notes (Signed)
 Subjective  CC:  Chief Complaint  Patient presents with   Diabetes    HPI: Jacqueline Rice is a 58 y.o. female who presents to the office today for follow up of diabetes and problems listed above in the chief complaint.  Discussed the use of AI scribe software for clinical note transcription with the patient, who gave verbal consent to proceed.  History of Present Illness   Jacqueline Rice is a 58 year old female with diabetes who presents for a follow-up on her diabetes management.  She is currently managing her diabetes with metformin, which we doubled to 1500mg  daily 3 months ago.  Despite this adjustment, her hemoglobin A1c has increased from 7.8 to 8.1. Her diet primarily consists of vegetables and meat, eats low sugars and feels well; and she is attempting to manage her condition through diet and exercise. She denies foot sxs or sores. Sees eye doctor regularly. See below. Eligible for prevnar. No HTN or ace.   Cholesterol is well controlled on crestor. No ae.  She has recently undergone laser surgery for a possible detached retina in both eyes. She is on a six-month follow-up schedule with her ophthalmologist, Dr. Wilford Corner, and has been referred to Dr. Fransisco Beau for further management. She has been informed that this condition is not related to her diabetes, and she has not been diagnosed with diabetic retinopathy.      Wt Readings from Last 3 Encounters:  06/23/23 167 lb 6.4 oz (75.9 kg)  03/18/23 172 lb 6.4 oz (78.2 kg)  12/13/22 169 lb 3.2 oz (76.7 kg)    BP Readings from Last 3 Encounters:  06/23/23 113/75  03/18/23 110/62  12/13/22 120/72    Assessment  1. Uncontrolled type 2 diabetes mellitus with hyperglycemia (HCC)   2. Class 1 obesity due to excess calories with serious comorbidity and body mass index (BMI) of 30.0 to 30.9 in adult   3. Relapsing remitting multiple sclerosis (HCC)   4. Need for pneumococcal 20-valent conjugate vaccination   5. Partial retinal  detachment of both eyes      Plan  Assessment and Plan    Type 2 Diabetes Mellitus A1c increased from 7.8 to 8.1 despite adherence to Metformin and dietary modifications. Discussed the need for additional medication due to persistent hyperglycemia and insulin resistance. -Continue Metformin (dose not specified in conversation). -Add Farxiga 10mg  daily for additional glycemic control, blood pressure management, and kidney protection. Discussed appropriate use, risks and benefits and possible complications including yeast infections. Hold for dehydration. -Refill Metformin prescription. -Recheck A1c in 3 months. -continue eye exams q 6 months  Pap Smear Due for routine Pap smear, last performed at this clinic. -Schedule Pap smear during physical in August to ensure insurance coverage.  Immunizations High risk for complications with certain infections due to diabetes. -Administer Prevnar (pneumonia vaccination) today.  Retinal Detachment History of laser surgery for retinal detachment, not related to diabetes. Regular follow-ups with ophthalmologist. -No additional plan discussed in conversation.  Follow-up Next appointment in 3 months for diabetes management and in August for physical and Pap smear.     Physical due in August 2025 w/ pap  Orders Placed This Encounter  Procedures   Pneumococcal conjugate vaccine 20-valent (Prevnar 20)   POCT HgB A1C   Meds ordered this encounter  Medications   dapagliflozin propanediol (FARXIGA) 10 MG TABS tablet    Sig: Take 1 tablet (10 mg total) by mouth daily.    Dispense:  90  tablet    Refill:  3   metFORMIN (GLUCOPHAGE-XR) 750 MG 24 hr tablet    Sig: Take 2 tablets (1,500 mg total) by mouth daily with breakfast.    Dispense:  180 tablet    Refill:  3      Immunization History  Administered Date(s) Administered   Influenza Inj Mdck Quad Pf 02/14/2018   Influenza, Seasonal, Injecte, Preservative Fre 01/31/2023    Influenza,inj,Quad PF,6+ Mos 02/02/2017, 02/04/2019, 02/24/2020, 02/15/2021, 03/13/2022   Moderna Covid-19 Fall Seasonal Vaccine 33yrs & older 01/31/2023   PFIZER Comirnaty(Gray Top)Covid-19 Tri-Sucrose Vaccine 08/19/2020   PFIZER(Purple Top)SARS-COV-2 Vaccination 07/04/2019, 07/25/2019, 03/11/2020   PNEUMOCOCCAL CONJUGATE-20 06/23/2023   Tdap 09/30/2017   Zoster Recombinant(Shingrix) 08/17/2019, 10/27/2019    Diabetes Related Lab Review: Lab Results  Component Value Date   HGBA1C 8.1 (A) 06/23/2023   HGBA1C 7.8 (A) 03/18/2023   HGBA1C 7.6 (A) 12/13/2022    Lab Results  Component Value Date   MICROALBUR 1.0 12/13/2022   Lab Results  Component Value Date   CREATININE 0.75 03/18/2023   BUN 15 03/18/2023   NA 137 03/18/2023   K 4.3 03/18/2023   CL 102 03/18/2023   CO2 26 03/18/2023   Lab Results  Component Value Date   CHOL 174 03/18/2023   CHOL 271 (H) 12/13/2022   CHOL 278 (H) 12/12/2021   Lab Results  Component Value Date   HDL 58.80 03/18/2023   HDL 50.90 12/13/2022   HDL 52.30 12/12/2021   Lab Results  Component Value Date   LDLCALC 78 03/18/2023   LDLCALC 181 (H) 12/13/2022   LDLCALC 134 (H) 10/04/2019   Lab Results  Component Value Date   TRIG 186.0 (H) 03/18/2023   TRIG 195.0 (H) 12/13/2022   TRIG 341.0 (H) 12/12/2021   Lab Results  Component Value Date   CHOLHDL 3 03/18/2023   CHOLHDL 5 12/13/2022   CHOLHDL 5 12/12/2021   Lab Results  Component Value Date   LDLDIRECT 183.0 12/12/2021   LDLDIRECT 110.0 10/04/2020   The 10-year ASCVD risk score (Arnett DK, et al., 2019) is: 2.7%   Values used to calculate the score:     Age: 80 years     Sex: Female     Is Non-Hispanic African American: No     Diabetic: Yes     Tobacco smoker: No     Systolic Blood Pressure: 113 mmHg     Is BP treated: No     HDL Cholesterol: 58.8 mg/dL     Total Cholesterol: 174 mg/dL I have reviewed the PMH, Fam and Soc history. Patient Active Problem List   Diagnosis  Date Noted Date Diagnosed   Class 1 obesity due to excess calories with serious comorbidity and body mass index (BMI) of 30.0 to 30.9 in adult 12/13/2022     Priority: High   Uncontrolled type 2 diabetes mellitus with hyperglycemia (HCC) 05/01/2022     Priority: High    A1c 6.29 September 2020, 7.27 November 2021, 7.04 May 2022: 1st visit for diabetes.  Met xr 750 daily, increased to 1500mg  daily 02/2023; pt defers 2nd med at this time.  05/2023: added farxiga 10 to metformin xr 1500mg  daily.     Combined hyperlipidemia associated with type 2 diabetes mellitus (HCC) 05/01/2022     Priority: High    Crestor 10 11/2022    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 11/16/2020     Priority:  High   Migraine without aura 06/01/2020     Priority: High   Postherpetic neuralgia 06/01/2020     Priority: High   Vitamin D deficiency 10/07/2016     Priority: Low    Social History: Patient  reports that she has never smoked. She has never used smokeless tobacco. She reports that she does not drink alcohol and does not use drugs.  Review of Systems: Ophthalmic: negative for eye pain, loss of vision or double vision Cardiovascular: negative for chest pain Respiratory: negative for SOB or persistent cough Gastrointestinal: negative for abdominal pain Genitourinary: negative for dysuria or gross hematuria MSK: negative for foot lesions Neurologic: negative for weakness or gait disturbance  Objective  Vitals: BP 113/75   Pulse 78   Temp 97.7 F (36.5 C)   Ht 5\' 2"  (1.575 m)   Wt 167 lb 6.4 oz (75.9 kg)   SpO2 97%   BMI 30.62 kg/m  General: well appearing, no acute distress  Psych:  Alert and oriented, normal mood and affect HEENT:  Normocephalic, atraumatic, moist mucous membranes, supple neck  Cardiovascular:  Nl S1 and S2, RRR without murmur, gallop or rub. no edema Respiratory:  Good breath sounds bilaterally, CTAB with normal effort, no  rales  Diabetic education: ongoing education regarding chronic disease management for diabetes was given today. We continue to reinforce the ABC's of diabetic management: A1c (<7 or 8 dependent upon patient), tight blood pressure control, and cholesterol management with goal LDL < 100 minimally. We discuss diet strategies, exercise recommendations, medication options and possible side effects. At each visit, we review recommended immunizations and preventive care recommendations for diabetics and stress that good diabetic control can prevent other problems. See below for this patient's data.   Commons side effects, risks, benefits, and alternatives for medications and treatment plan prescribed today were discussed, and the patient expressed understanding of the given instructions. Patient is instructed to call or message via MyChart if he/she has any questions or concerns regarding our treatment plan. No barriers to understanding were identified. We discussed Red Flag symptoms and signs in detail. Patient expressed understanding regarding what to do in case of urgent or emergency type symptoms.  Medication list was reconciled, printed and provided to the patient in AVS. Patient instructions and summary information was reviewed with the patient as documented in the AVS. This note was prepared with assistance of Dragon voice recognition software. Occasional wrong-word or sound-a-like substitutions may have occurred due to the inherent limitations of voice recognition software

## 2023-06-23 NOTE — Patient Instructions (Signed)
 Please return in 3 months for diabetes follow up and can make appt in 6 months for complete physical with pap smear.   If you have any questions or concerns, please don't hesitate to send me a message via MyChart or call the office at [phone]. Thank you for visiting with [place] today! It's our pleasure caring for you.   VISIT SUMMARY:  Today, we discussed your diabetes management, recent eye surgery, and general health maintenance. Your hemoglobin A1c has increased slightly, and we have made some adjustments to your medication. We also addressed your need for a Pap smear and administered a pneumonia vaccination.  YOUR PLAN:  -TYPE 2 DIABETES MELLITUS: Type 2 Diabetes Mellitus is a condition where your body does not use insulin properly, leading to high blood sugar levels. Your A1c has increased from 7.8 to 8.1, so we will continue your Metformin and add either [place] or [place]ard[place] to help control your blood sugar, manage blood pressure, and protect your kidneys. We have refilled your Metformin prescription and will recheck your A1c in 3 months.  -PAP SMEAR: A Pap smear is a routine test to screen for cervical cancer. You are due for this test, and we will schedule it during your physical in August to ensure it is covered by your insurance.  -IMMUNIZATIONS: Due to your diabetes, you are at higher risk for complications with certain infections. We have administered the Prevnar vaccine today to protect you against pneumonia.  -RETINAL DETACHMENT: Retinal detachment is a condition where the retina peels away from its underlying layer of support tissue. You have had laser surgery for this and will continue regular follow-ups with your ophthalmologist.  INSTRUCTIONS:  Please follow up in 3 months for diabetes management and in August for your physical and Pap smear. Continue taking your medications as prescribed and monitor your blood sugar levels regularly.

## 2023-09-25 ENCOUNTER — Encounter: Payer: Self-pay | Admitting: Family Medicine

## 2023-09-25 ENCOUNTER — Ambulatory Visit: Payer: BC Managed Care – PPO | Admitting: Family Medicine

## 2023-09-25 VITALS — BP 107/70 | HR 63 | Temp 97.7°F | Ht 62.0 in | Wt 163.2 lb

## 2023-09-25 DIAGNOSIS — E66811 Obesity, class 1: Secondary | ICD-10-CM

## 2023-09-25 DIAGNOSIS — E6609 Other obesity due to excess calories: Secondary | ICD-10-CM | POA: Diagnosis not present

## 2023-09-25 DIAGNOSIS — E119 Type 2 diabetes mellitus without complications: Secondary | ICD-10-CM | POA: Diagnosis not present

## 2023-09-25 DIAGNOSIS — Z7984 Long term (current) use of oral hypoglycemic drugs: Secondary | ICD-10-CM

## 2023-09-25 DIAGNOSIS — Z6830 Body mass index (BMI) 30.0-30.9, adult: Secondary | ICD-10-CM

## 2023-09-25 LAB — POCT GLYCOSYLATED HEMOGLOBIN (HGB A1C): Hemoglobin A1C: 6.8 % — AB (ref 4.0–5.6)

## 2023-09-25 NOTE — Patient Instructions (Addendum)
 Please follow up as scheduled for your next visit with me: 12/24/2023   If you have any questions or concerns, please don't hesitate to send me a message via MyChart or call the office at [phone]. Thank you for visiting with us  today! It's our pleasure caring for you.    VISIT SUMMARY: You had a follow-up appointment today to check on your diabetes management. Your A1c has improved from 8.1% to 6.8% after starting Farxiga , and you are tolerating the medication well. You mentioned no problems with your feet and shared your plans to visit your son in [place] at the end of July.  YOUR PLAN: -TYPE 2 DIABETES MELLITUS: Type 2 diabetes is a condition where your body does not use insulin properly, leading to high blood sugar levels. Your A1c has improved to 6.8% after adding Farxiga  to your treatment plan. You should continue taking metformin  and Farxiga  10 mg daily to keep your diabetes well-controlled.  INSTRUCTIONS: Please continue taking your medications as prescribed. Your target A1c is under 7%, ideally 6.5%. Have a safe trip to [place] and make sure to monitor your blood sugar levels regularly.                      Contains text generated by Abridge.                                 Contains text generated by Abridge.

## 2023-09-25 NOTE — Progress Notes (Signed)
 Subjective  CC:  Chief Complaint  Patient presents with   Diabetes    HPI: Jacqueline Rice is a 58 y.o. female who presents to the office today for follow up of diabetes and problems listed above in the chief complaint.  Discussed the use of AI scribe software for clinical note transcription with the patient, who gave verbal consent to proceed.  History of Present Illness Jacqueline Rice is a 58 year old female with diabetes who presents for follow-up.  Three months ago, her A1c was 8.1%. At that time, Farxiga  10 mg daily was added to her regimen. Today, her A1c has improved to 6.8%.  Initially, she experienced increased urination after starting Farxiga , but this symptom has since resolved. She has not noticed any other significant changes in her condition and is tolerating the medication well.  She eats a diabetic diet.  Physically feels well without symptoms of hyperglycemia.  Eye exam current.  No retinopathy.  No neuropathy.  No problems with her feet. She plans to visit her son in [place] at the end of July to help him move out after his school year ends.  Obesity: Weight continues to trend down.  She is 10 pounds less since November.  4 pounds since February.  Diet continues to be healthy.  Farxiga  lately helping with weight loss as well.  Blood pressure is excellent.  Wt Readings from Last 3 Encounters:  09/25/23 163 lb 3.2 oz (74 kg)  06/23/23 167 lb 6.4 oz (75.9 kg)  03/18/23 172 lb 6.4 oz (78.2 kg)    BP Readings from Last 3 Encounters:  09/25/23 107/70  06/23/23 113/75  03/18/23 110/62    Assessment  1. Type 2 diabetes mellitus without complication, without long-term current use of insulin (HCC)   2. Class 1 obesity due to excess calories with serious comorbidity and body mass index (BMI) of 30.0 to 30.9 in adult   3. Diabetes mellitus treated with oral medication (HCC)      Plan  Assessment and Plan Assessment & Plan Type 2 diabetes mellitus Diabetes  well-controlled with improved A1c from 8.1% to 6.8% after adding Farxiga . Target A1c under 7%, ideally 6.5%. Tolerating Farxiga  well. - Continue metformin  and Farxiga  10 mg daily. - Continue diabetic diet  Obesity: Continue healthy diet and weight loss.  Recommend exercise.  Follow up: August for complete physical with Pap Orders Placed This Encounter  Procedures   POCT HgB A1C   No orders of the defined types were placed in this encounter.     Immunization History  Administered Date(s) Administered   Influenza Inj Mdck Quad Pf 02/14/2018   Influenza, Seasonal, Injecte, Preservative Fre 01/31/2023   Influenza,inj,Quad PF,6+ Mos 02/02/2017, 02/04/2019, 02/24/2020, 02/15/2021, 03/13/2022   Moderna Covid-19 Fall Seasonal Vaccine 41yrs & older 01/31/2023   PFIZER Comirnaty(Gray Top)Covid-19 Tri-Sucrose Vaccine 08/19/2020   PFIZER(Purple Top)SARS-COV-2 Vaccination 07/04/2019, 07/25/2019, 03/11/2020   PNEUMOCOCCAL CONJUGATE-20 06/23/2023   Tdap 09/30/2017   Zoster Recombinant(Shingrix ) 08/17/2019, 10/27/2019    Diabetes Related Lab Review: Lab Results  Component Value Date   HGBA1C 6.8 (A) 09/25/2023   HGBA1C 8.1 (A) 06/23/2023   HGBA1C 7.8 (A) 03/18/2023    Lab Results  Component Value Date   MICROALBUR 1.0 12/13/2022   Lab Results  Component Value Date   CREATININE 0.75 03/18/2023   BUN 15 03/18/2023   NA 137 03/18/2023   K 4.3 03/18/2023   CL 102 03/18/2023   CO2 26 03/18/2023   Lab Results  Component Value Date   CHOL 174 03/18/2023   CHOL 271 (H) 12/13/2022   CHOL 278 (H) 12/12/2021   Lab Results  Component Value Date   HDL 58.80 03/18/2023   HDL 50.90 12/13/2022   HDL 52.30 12/12/2021   Lab Results  Component Value Date   LDLCALC 78 03/18/2023   LDLCALC 181 (H) 12/13/2022   LDLCALC 134 (H) 10/04/2019   Lab Results  Component Value Date   TRIG 186.0 (H) 03/18/2023   TRIG 195.0 (H) 12/13/2022   TRIG 341.0 (H) 12/12/2021   Lab Results  Component  Value Date   CHOLHDL 3 03/18/2023   CHOLHDL 5 12/13/2022   CHOLHDL 5 12/12/2021   Lab Results  Component Value Date   LDLDIRECT 183.0 12/12/2021   LDLDIRECT 110.0 10/04/2020   The 10-year ASCVD risk score (Arnett DK, et al., 2019) is: 2.4%   Values used to calculate the score:     Age: 81 years     Sex: Female     Is Non-Hispanic African American: No     Diabetic: Yes     Tobacco smoker: No     Systolic Blood Pressure: 107 mmHg     Is BP treated: No     HDL Cholesterol: 58.8 mg/dL     Total Cholesterol: 174 mg/dL I have reviewed the PMH, Fam and Soc history. Patient Active Problem List   Diagnosis Date Noted Date Diagnosed   Class 1 obesity due to excess calories with serious comorbidity and body mass index (BMI) of 30.0 to 30.9 in adult 12/13/2022     Priority: High   Uncontrolled type 2 diabetes mellitus with hyperglycemia (HCC) 05/01/2022     Priority: High    A1c 6.29 September 2020, 7.27 November 2021, 7.04 May 2022: 1st visit for diabetes.  Met xr 750 daily, increased to 1500mg  daily 02/2023; pt defers 2nd med at this time.  05/2023: added farxiga  10 to metformin  xr 1500mg  daily.     Combined hyperlipidemia associated with type 2 diabetes mellitus (HCC) 05/01/2022     Priority: High    Crestor  10 11/2022    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 11/16/2020     Priority: High   Migraine without aura 06/01/2020     Priority: High   Postherpetic neuralgia 06/01/2020     Priority: High   Vitamin D  deficiency 10/07/2016     Priority: Low    Social History: Patient  reports that she has never smoked. She has never used smokeless tobacco. She reports that she does not drink alcohol and does not use drugs.  Review of Systems: Ophthalmic: negative for eye pain, loss of vision or double vision Cardiovascular: negative for chest pain Respiratory: negative for SOB or persistent cough Gastrointestinal: negative for abdominal  pain Genitourinary: negative for dysuria or gross hematuria MSK: negative for foot lesions Neurologic: negative for weakness or gait disturbance  Objective  Vitals: BP 107/70   Pulse 63   Temp 97.7 F (36.5 C)   Ht 5\' 2"  (1.575 m)   Wt 163 lb 3.2 oz (74 kg)   SpO2 97%   BMI 29.85 kg/m  General: well appearing, no acute distress  Psych:  Alert and oriented, normal mood and affect HEENT:  Normocephalic, atraumatic, moist mucous membranes, supple neck  Cardiovascular:  Nl S1 and S2, RRR without murmur, gallop or rub. no edema Respiratory:  Good breath sounds bilaterally, CTAB with normal  effort, no rales   Diabetic education: ongoing education regarding chronic disease management for diabetes was given today. We continue to reinforce the ABC's of diabetic management: A1c (<7 or 8 dependent upon patient), tight blood pressure control, and cholesterol management with goal LDL < 100 minimally. We discuss diet strategies, exercise recommendations, medication options and possible side effects. At each visit, we review recommended immunizations and preventive care recommendations for diabetics and stress that good diabetic control can prevent other problems. See below for this patient's data. Commons side effects, risks, benefits, and alternatives for medications and treatment plan prescribed today were discussed, and the patient expressed understanding of the given instructions. Patient is instructed to call or message via MyChart if he/she has any questions or concerns regarding our treatment plan. No barriers to understanding were identified. We discussed Red Flag symptoms and signs in detail. Patient expressed understanding regarding what to do in case of urgent or emergency type symptoms.  Medication list was reconciled, printed and provided to the patient in AVS. Patient instructions and summary information was reviewed with the patient as documented in the AVS. This note was prepared with  assistance of Dragon voice recognition software. Occasional wrong-word or sound-a-like substitutions may have occurred due to the inherent limitations of voice recognition software

## 2023-12-24 ENCOUNTER — Other Ambulatory Visit (HOSPITAL_COMMUNITY)
Admission: RE | Admit: 2023-12-24 | Discharge: 2023-12-24 | Disposition: A | Discharge status: Home / Self Care | Source: Ambulatory Visit | Attending: Family Medicine | Admitting: Family Medicine

## 2023-12-24 ENCOUNTER — Ambulatory Visit (INDEPENDENT_AMBULATORY_CARE_PROVIDER_SITE_OTHER): Payer: BC Managed Care – PPO | Admitting: Family Medicine

## 2023-12-24 ENCOUNTER — Encounter: Payer: Self-pay | Admitting: Family Medicine

## 2023-12-24 VITALS — BP 109/73 | HR 81 | Temp 97.7°F | Ht 62.0 in | Wt 157.0 lb

## 2023-12-24 DIAGNOSIS — G35 Multiple sclerosis: Secondary | ICD-10-CM | POA: Diagnosis not present

## 2023-12-24 DIAGNOSIS — Z0001 Encounter for general adult medical examination with abnormal findings: Secondary | ICD-10-CM

## 2023-12-24 DIAGNOSIS — Z124 Encounter for screening for malignant neoplasm of cervix: Secondary | ICD-10-CM | POA: Insufficient documentation

## 2023-12-24 DIAGNOSIS — E782 Mixed hyperlipidemia: Secondary | ICD-10-CM | POA: Diagnosis not present

## 2023-12-24 DIAGNOSIS — E559 Vitamin D deficiency, unspecified: Secondary | ICD-10-CM | POA: Diagnosis not present

## 2023-12-24 DIAGNOSIS — E1169 Type 2 diabetes mellitus with other specified complication: Secondary | ICD-10-CM

## 2023-12-24 DIAGNOSIS — Z7984 Long term (current) use of oral hypoglycemic drugs: Secondary | ICD-10-CM

## 2023-12-24 DIAGNOSIS — E119 Type 2 diabetes mellitus without complications: Secondary | ICD-10-CM | POA: Diagnosis not present

## 2023-12-24 LAB — CBC WITH DIFFERENTIAL/PLATELET
Basophils Absolute: 0 K/uL (ref 0.0–0.1)
Basophils Relative: 0.7 % (ref 0.0–3.0)
Eosinophils Absolute: 0.3 K/uL (ref 0.0–0.7)
Eosinophils Relative: 4.6 % (ref 0.0–5.0)
HCT: 42.3 % (ref 36.0–46.0)
Hemoglobin: 13.6 g/dL (ref 12.0–15.0)
Lymphocytes Relative: 31.9 % (ref 12.0–46.0)
Lymphs Abs: 1.8 K/uL (ref 0.7–4.0)
MCHC: 32.2 g/dL (ref 30.0–36.0)
MCV: 82.1 fl (ref 78.0–100.0)
Monocytes Absolute: 0.3 K/uL (ref 0.1–1.0)
Monocytes Relative: 5.7 % (ref 3.0–12.0)
Neutro Abs: 3.2 K/uL (ref 1.4–7.7)
Neutrophils Relative %: 57.1 % (ref 43.0–77.0)
Platelets: 246 K/uL (ref 150.0–400.0)
RBC: 5.15 Mil/uL — ABNORMAL HIGH (ref 3.87–5.11)
RDW: 14.2 % (ref 11.5–15.5)
WBC: 5.6 K/uL (ref 4.0–10.5)

## 2023-12-24 LAB — LIPID PANEL
Cholesterol: 166 mg/dL (ref 0–200)
HDL: 57.3 mg/dL (ref >39.00)
LDL Cholesterol: 70 mg/dL (ref 0–99)
NonHDL: 109.17
Total CHOL/HDL Ratio: 3
Triglycerides: 197 mg/dL — ABNORMAL HIGH (ref 0.0–149.0)
VLDL: 39.4 mg/dL (ref 0.0–40.0)

## 2023-12-24 LAB — MICROALBUMIN / CREATININE URINE RATIO
Creatinine,U: 115.5 mg/dL
Microalb Creat Ratio: 12.5 mg/g (ref 0.0–30.0)
Microalb, Ur: 1.4 mg/dL (ref 0.0–1.9)

## 2023-12-24 LAB — COMPREHENSIVE METABOLIC PANEL WITH GFR
ALT: 23 U/L (ref 0–35)
AST: 16 U/L (ref 0–37)
Albumin: 4.4 g/dL (ref 3.5–5.2)
Alkaline Phosphatase: 49 U/L (ref 39–117)
BUN: 17 mg/dL (ref 6–23)
CO2: 26 meq/L (ref 19–32)
Calcium: 9.5 mg/dL (ref 8.4–10.5)
Chloride: 103 meq/L (ref 96–112)
Creatinine, Ser: 0.73 mg/dL (ref 0.40–1.20)
GFR: 91.66 mL/min (ref >60.00)
Glucose, Bld: 108 mg/dL — ABNORMAL HIGH (ref 70–99)
Potassium: 4.4 meq/L (ref 3.5–5.1)
Sodium: 140 meq/L (ref 135–145)
Total Bilirubin: 0.4 mg/dL (ref 0.2–1.2)
Total Protein: 7.7 g/dL (ref 6.0–8.3)

## 2023-12-24 LAB — TSH: TSH: 1.4 u[IU]/mL (ref 0.35–5.50)

## 2023-12-24 LAB — VITAMIN D 25 HYDROXY (VIT D DEFICIENCY, FRACTURES): VITD: 26.79 ng/mL — ABNORMAL LOW (ref 30.00–100.00)

## 2023-12-24 LAB — HEMOGLOBIN A1C: Hgb A1c MFr Bld: 7.6 % — ABNORMAL HIGH (ref 4.6–6.5)

## 2023-12-24 NOTE — Patient Instructions (Signed)
 Please return in 6 months for diabetes follow up   I will release your lab results to you on your MyChart account with further instructions. You may see the results before I do, but when I review them I will send you a message with my report or have my assistant call you if things need to be discussed. Please reply to my message with any questions. Thank you!   If you have any questions or concerns, please don't hesitate to send me a message via MyChart or call the office at [phone]. Thank you for visiting with us  today! It's our pleasure caring for you.    VISIT SUMMARY: Today, you had a routine follow-up visit. Your multiple sclerosis is stable with no new symptoms, and you are doing well with your medications. You have no symptoms of high blood sugar, chest pain, shortness of breath, or abdominal issues. Your weight is stable, and you are tolerating your cholesterol medication well. You are postmenopausal with no bothersome symptoms. A Pap smear was performed during this visit.  YOUR PLAN: -ADULT WELLNESS VISIT: This visit was a routine check-up to monitor your overall health. Your blood pressure is stable. A Pap smear was performed, and blood work and a urine test were ordered. Please ensure you complete your eye exam as scheduled. We will review your lab results and communicate any necessary changes.  -TYPE 2 DIABETES MELLITUS: Type 2 diabetes is a condition where your body does not use insulin properly, leading to high blood sugar levels. Your diabetes is well-controlled with no signs of high blood sugar or kidney issues. Continue with your current diabetes management plan, and we will review your lab results to make any necessary adjustments.  -MULTIPLE SCLEROSIS: Multiple sclerosis is a condition that affects the central nervous system, leading to a range of symptoms. Your condition is well-managed with no new symptoms. Continue with your current treatment plan.  -HYPERLIPIDEMIA:  Hyperlipidemia is a condition where there are high levels of fats (lipids) in your blood. Your condition is managed with medication that you are tolerating well. Continue with your current medication.  -POSTMENOPAUSAL STATE: Being postmenopausal means you have not had a menstrual period for at least 12 months. You have no bothersome symptoms related to menopause. No specific treatment is needed at this time.  INSTRUCTIONS: Please complete your eye exam as scheduled tomorrow. We will review your lab results and communicate any necessary changes. Continue with your current medications and management plans.                      Contains text generated by Abridge.                                 Contains text generated by Abridge.

## 2023-12-24 NOTE — Progress Notes (Signed)
 Subjective  Chief Complaint  Patient presents with   Annual Exam    Pt here for Annual Exam and is currently fasting. Eye exam is scheduled for 8/29.   Diabetes   Hyperlipidemia   vitamin D  defeciency    HPI: Jacqueline Rice is a 58 y.o. female who presents to [facility] [place] today for a Female Wellness Visit. She also has the concerns and/or needs as listed above in the chief complaint. These will be addressed in addition to the Health Maintenance Visit.   Wellness Visit: annual visit with health maintenance review and exam  HM: mammo up to date and normal. CRC screen normal and current. Pap due today. Due flu shot. Other imms up to date. Due eye exam: no retinopathy.  Chronic disease f/u and/or acute problem visit: (deemed necessary to be done in addition to the wellness visit): Discussed the use of AI scribe software for clinical note transcription with the patient, who gave verbal consent to proceed.  History of Present Illness Jacqueline Rice is a 58 year old female with relapsing multiple sclerosis who presents for a routine follow-up visit of diabetes, hyperlipidemia and vit d deficiency.  Her multiple sclerosis is stable with no new symptoms. She reports doing okay with her medications and has not noticed any new symptoms of multiple sclerosis.  Diabetes follow-up on Farxiga  and metformin .  Doing well.  Weight continues to trend downward.  Eating well.  No symptoms of high blood sugar, chest pain, shortness of breath, or abdominal issues. She does not have high blood pressure or diabetes.  She has provide exam scheduled for tomorrow.  No history of retinopathy.  No symptoms of neuropathy.  She has no known complications.  She is normotensive.  Due for nephropathy screen.  She is postmenopausal with no bothersome menopausal symptoms. Her last menstrual period was two to three years ago.  Hyperlipidemia: She is tolerating her cholesterol medication well and  has not experienced any issues with her feet since the last visit.  She is fasting for her recheck today.  History of vitamin D  deficiency on oral supplements.   Assessment  1. Encounter for well adult exam with abnormal findings   2. Cervical cancer screening   3. Vitamin D  deficiency   4. Relapsing remitting multiple sclerosis (HCC)   5. Combined hyperlipidemia associated with type 2 diabetes mellitus (HCC)   6. Controlled type 2 diabetes mellitus without complication, without long-term current use of insulin (HCC)   7. Diabetes mellitus treated with oral medication [facility])      Plan  Female Wellness Visit: Age appropriate Health Maintenance and Prevention measures were discussed with patient. Included topics are cancer screening recommendations, ways to keep healthy (see AVS) including dietary and exercise recommendations, regular eye and dental care, use of seat belts, and avoidance of moderate alcohol use and tobacco use.  Pap smear with high risk HPV cotesting today.  Other screens current. BMI: discussed patient's BMI and encouraged positive lifestyle modifications to help get to or maintain a target BMI. HM needs and immunizations were addressed and ordered. See below for orders. See HM and immunization section for updates.  Recommend flu shot in the fall. Routine labs and screening tests ordered including cmp, cbc and lipids where appropriate. Discussed recommendations regarding Vit D and calcium  supplementation (see AVS)  Chronic disease management visit and/or acute problem visit: Assessment and Plan Assessment & Plan  Type 2 diabetes mellitus Diabetes well-controlled, no hyperglycemia  or microalbuminuria. - Monitor for microalbuminuria. - Continue current diabetes management.  Farxiga  and metformin . - Review lab results for any changes in diabetes management. - Eye exam tomorrow.  Multiple sclerosis In remission.  Hyperlipidemia Managed with well-tolerated medication.   Continue statin and check lipids and LFTs today.  Goal LDL less than 70.  Postmenopausal state No bothersome symptoms, last menstrual period two to three years ago.\  Recheck vitamin D  level.   Follow up: 6 months to follow-up on diabetes Orders Placed This Encounter  Procedures   CBC with Differential/Platelet   Comprehensive metabolic panel with GFR   Lipid panel   Hemoglobin A1c   TSH   Microalbumin / creatinine urine ratio   VITAMIN D  25 Hydroxy (Vit-D Deficiency, Fractures)   No orders of the defined types were placed in this encounter.     Body mass index is 28.72 kg/m. Wt Readings from Last 3 Encounters:  12/24/23 157 lb (71.2 kg)  09/25/23 163 lb 3.2 oz (74 kg)  06/23/23 167 lb 6.4 oz (75.9 kg)     Patient Active Problem List   Diagnosis Date Noted   Class 1 obesity due to excess calories with serious comorbidity and body mass index (BMI) of 30.0 to 30.9 in adult 12/13/2022    Priority: High   Controlled type 2 diabetes mellitus without complication, without long-term current use of insulin (HCC) 05/01/2022    Priority: High    A1c 6.29 September 2020, 7.27 November 2021, 7.04 May 2022: 1st visit for diabetes.  Met xr 750 daily, increased to 1500mg  daily 02/2023; pt defers 2nd med at this time.  05/2023: added farxiga  10 to metformin  xr 1500mg  daily.  Normotensive not on ace.     Combined hyperlipidemia associated with type 2 diabetes mellitus (HCC) 05/01/2022    Priority: High    Crestor  10 11/2022    Relapsing remitting multiple sclerosis (HCC) 06/07/2021    Priority: High   Widowed 06/2020 11/16/2020    Priority: High   Migraine without aura 06/01/2020    Priority: High   Postherpetic neuralgia 06/01/2020    Priority: High   Vitamin D  deficiency 10/07/2016    Priority: Low   Health Maintenance  Topic Date Due   Diabetic kidney evaluation - Urine ACR  Never done   Hepatitis B Vaccines 19-59 Average Risk (1 of 3 - 19+ 3-dose series) Never done   Cervical  Cancer Screening (HPV/Pap Cotest)  10/08/2019   INFLUENZA VACCINE  11/28/2023   OPHTHALMOLOGY EXAM  12/11/2023   Diabetic kidney evaluation - eGFR measurement  03/17/2024   HEMOGLOBIN A1C  03/27/2024   MAMMOGRAM  06/12/2024   FOOT EXAM  12/23/2024   DTaP/Tdap/Td (2 - Td or Tdap) 10/01/2027   Colonoscopy  02/24/2028   Pneumococcal Vaccine: 50+ Years  Completed   COVID-19 Vaccine  Completed   Hepatitis C Screening  Completed   HIV Screening  Completed   Zoster Vaccines- Shingrix   Completed   HPV VACCINES  Aged Out   Meningococcal B Vaccine  Aged Out   Immunization History  Administered Date(s) Administered   Influenza Inj Mdck Quad Pf 02/14/2018   Influenza, Seasonal, Injecte, Preservative Fre 01/31/2023   Influenza,inj,Quad PF,6+ Mos 02/02/2017, 02/04/2019, 02/24/2020, 02/15/2021, 03/13/2022   Moderna Covid-19 Fall Seasonal Vaccine 50yrs & older 01/31/2023   PFIZER Comirnaty(Gray Top)Covid-19 Tri-Sucrose Vaccine 08/19/2020   PFIZER(Purple Top)SARS-COV-2 Vaccination 07/04/2019, 07/25/2019, 03/11/2020   PNEUMOCOCCAL CONJUGATE-20 06/23/2023   Tdap 09/30/2017   Zoster Recombinant(Shingrix ) 08/17/2019, 10/27/2019  We updated and reviewed the patient's past history in detail and it is documented below. Allergies: Patient is allergic to amoxicillin and penicillins. Past Medical History Patient  has a past medical history of Allergy, Gestational diabetes (2005), and Vitamin D  deficiency (10/07/2016). Past Surgical History Patient  has a past surgical history that includes Ovarian cyst removal (20 years ago) and Cholecystectomy. Family History: Patient family history includes Breast cancer in her paternal aunt; Diabetes in her mother; Healthy in her daughter and son; High Cholesterol in her father; High blood pressure in her father; Mental illness in her brother and mother. Social History:  Patient  reports that she has never smoked. She has never used smokeless tobacco. She reports that  she does not drink alcohol and does not use drugs.  Review of Systems: Constitutional: negative for fever or malaise Ophthalmic: negative for photophobia, double vision or loss of vision Cardiovascular: negative for chest pain, dyspnea on exertion, or new LE swelling Respiratory: negative for SOB or persistent cough Gastrointestinal: negative for abdominal pain, change in bowel habits or melena Genitourinary: negative for dysuria or gross hematuria, no abnormal uterine bleeding or disharge Musculoskeletal: negative for new gait disturbance or muscular weakness Integumentary: negative for new or persistent rashes, no breast lumps Neurological: negative for TIA or stroke symptoms Psychiatric: negative for SI or delusions Allergic/Immunologic: negative for hives  Patient Care Team    Relationship Specialty Notifications Start End  Jodie Lavern CROME, MD PCP - General Family Medicine  10/04/19   Octavia Charlie Hamilton, MD Consulting Physician Ophthalmology  10/12/18   Onita Duos, MD Consulting Physician Neurology  10/04/19   Valdemar Rogue, MD Consulting Physician Ophthalmology  06/23/23     Objective  Vitals: BP 109/73   Pulse 81   Temp 97.7 F (36.5 C)   Ht 5' 2 (1.575 m)   Wt 157 lb (71.2 kg)   SpO2 94%   BMI 28.72 kg/m  General:  Well developed, well nourished, no acute distress  Psych:  Alert and orientedx3,normal mood and affect HEENT:  Normocephalic, atraumatic, non-icteric sclera,  supple neck without adenopathy, mass or thyromegaly Cardiovascular:  Normal S1, S2, RRR without gallop, rub or murmur Respiratory:  Good breath sounds bilaterally, CTAB with normal respiratory effort Gastrointestinal: normal bowel sounds, soft, non-tender, no noted masses. No HSM MSK: extremities without edema, joints without erythema or swelling Neurologic:    Mental status is normal.  Gross motor and sensory exams are normal.  No tremor Pelvic Exam: Normal external genitalia, no vulvar or vaginal  lesions present. Clear cervix w/o CMT. Bimanual exam reveals a nontender fundus w/o masses, nl size. No adnexal masses present. No inguinal adenopathy. A PAP smear was performed.    Commons side effects, risks, benefits, and alternatives for medications and treatment plan prescribed today were discussed, and the patient expressed understanding of the given instructions. Patient is instructed to call or message via MyChart if he/she has any questions or concerns regarding our treatment plan. No barriers to understanding were identified. We discussed Red Flag symptoms and signs in detail. Patient expressed understanding regarding what to do in case of urgent or emergency type symptoms.  Medication list was reconciled, printed and provided to the patient in AVS. Patient instructions and summary information was reviewed with the patient as documented in the AVS. This note was prepared with assistance of Dragon voice recognition software. Occasional wrong-word or sound-a-like substitutions may have occurred due to the inherent limitations of voice recognition software

## 2023-12-25 DIAGNOSIS — H33302 Unspecified retinal break, left eye: Secondary | ICD-10-CM | POA: Diagnosis not present

## 2023-12-25 DIAGNOSIS — H2513 Age-related nuclear cataract, bilateral: Secondary | ICD-10-CM | POA: Diagnosis not present

## 2023-12-25 DIAGNOSIS — E119 Type 2 diabetes mellitus without complications: Secondary | ICD-10-CM | POA: Diagnosis not present

## 2023-12-25 DIAGNOSIS — G35 Multiple sclerosis: Secondary | ICD-10-CM | POA: Diagnosis not present

## 2023-12-25 DIAGNOSIS — H35413 Lattice degeneration of retina, bilateral: Secondary | ICD-10-CM | POA: Diagnosis not present

## 2023-12-26 LAB — CYTOLOGY - PAP
Comment: NEGATIVE
Diagnosis: NEGATIVE
High risk HPV: NEGATIVE

## 2023-12-29 ENCOUNTER — Ambulatory Visit: Payer: Self-pay | Admitting: Family Medicine

## 2023-12-29 NOTE — Progress Notes (Signed)
 See mychart note Dear Ms. Spink, I have reviewed your lab results and mostly all is well! Cholesterol, kidneys, liver, thyroid  and blood counts are all normal. However, your diabetes test is again above goal at 7.6. this is a little surprising given that you are eating well and feeling well.  If there is room for improvement in your diet, then work hard on that. Otherwise, I will need to recommend adding more medication: either maxing out the metformin  to 3 tabs daily or adding a 3rd medication.  Let's recheck things in 3 months and can decide then after we see where your diabetes test is at that time.  Everything else is good: just need to add vit D 2000 units daily.  Sincerely, Dr. Jodie

## 2024-02-24 ENCOUNTER — Other Ambulatory Visit: Payer: Self-pay | Admitting: Medical Genetics

## 2024-02-24 DIAGNOSIS — Z006 Encounter for examination for normal comparison and control in clinical research program: Secondary | ICD-10-CM

## 2024-03-29 ENCOUNTER — Ambulatory Visit: Admitting: Family Medicine

## 2024-04-01 ENCOUNTER — Encounter: Payer: Self-pay | Admitting: Family Medicine

## 2024-04-01 ENCOUNTER — Ambulatory Visit: Admitting: Family Medicine

## 2024-04-01 VITALS — BP 110/72 | HR 85 | Temp 97.7°F | Ht 62.0 in | Wt 158.0 lb

## 2024-04-01 DIAGNOSIS — E1165 Type 2 diabetes mellitus with hyperglycemia: Secondary | ICD-10-CM | POA: Diagnosis not present

## 2024-04-01 DIAGNOSIS — E559 Vitamin D deficiency, unspecified: Secondary | ICD-10-CM

## 2024-04-01 DIAGNOSIS — Z7984 Long term (current) use of oral hypoglycemic drugs: Secondary | ICD-10-CM | POA: Diagnosis not present

## 2024-04-01 LAB — VITAMIN D 25 HYDROXY (VIT D DEFICIENCY, FRACTURES): VITD: 33.04 ng/mL (ref 30.00–100.00)

## 2024-04-01 LAB — HEMOGLOBIN A1C: Hgb A1c MFr Bld: 7 % — ABNORMAL HIGH (ref 4.6–6.5)

## 2024-04-01 NOTE — Progress Notes (Signed)
 Subjective  CC:  Chief Complaint  Patient presents with   Diabetes    HPI: Jacqueline Rice is a 58 y.o. female who presents to the office today for follow up of diabetes and problems listed above in the chief complaint.  Discussed the use of AI scribe software for clinical note transcription with the patient, who gave verbal consent to proceed.  History of Present Illness Jacqueline Rice is a 58 year old female with type 2 diabetes who presents for follow-up of her elevated A1c levels.  Glycemic control - Elevated hemoglobin A1c noted at recent physical exam, higher than expected. 7.6 - Type 2 diabetes managed with Farxiga  at maximum dose and metformin  near maximum dose. - No gastrointestinal side effects from metformin . - Dietary modifications underway, including increased protein intake. - feels well - eye exam current  requesting records  Vitamin d  deficiency - Low vitamin D  level being addressed with supplementation. - Taking one pill twice daily, believed to be 2000 IU per dose, but exact dosage uncertain.  General health status - Feels well overall. - No significant weight changes. - No other health concerns reported.    Wt Readings from Last 3 Encounters:  04/01/24 158 lb (71.7 kg)  12/24/23 157 lb (71.2 kg)  09/25/23 163 lb 3.2 oz (74 kg)    BP Readings from Last 3 Encounters:  04/01/24 110/72  12/24/23 109/73  09/25/23 107/70    Assessment  1. Uncontrolled type 2 diabetes mellitus with hyperglycemia (HCC)   2. Vitamin D  deficiency      Plan  Assessment and Plan Assessment & Plan Type 2 diabetes mellitus with hyperglycemia A1c is elevated, indicating suboptimal glycemic control despite dietary efforts. Current medications include Farxiga  at maximum dose and metformin  close to maximum dose. If A1c remains above 7, it suggests metabolic function is not keeping up with dietary efforts, necessitating medication adjustment. Discussed potential addition of  GLP-1 receptor agonists if A1c is in the 7s, which could improve metabolic function, aid in weight loss, and provide cardiovascular benefits. GLP-1s are administered as injections, with potential side effects including nausea. If well-tolerated, GLP-1s might allow discontinuation of metformin . - Ordered blood test to check A1c and vitamin D  levels. - Continue Farxiga  at current dose. - If A1c is above 7, will consider increasing metformin  dose if tolerated. - If A1c is in the 7s, will discuss starting GLP-1 receptor agonist therapy. - Will communicate lab results via MyChart. - Will schedule follow-up in 3 months to reassess diabetes management.  Vitamin D  deficiency Vitamin D  levels are low. Currently taking vitamin D  supplements, but the exact dosage is unclear. Discussed the importance of maintaining appropriate vitamin D  levels to avoid complications associated with excessive supplementation. - Ordered blood test to check vitamin D  levels. - Verify current vitamin D  dosage and adjust if necessary.    Follow up: 3 mo for diabetes recheck if adding meds, otherwise 6 mo for dm recheck Orders Placed This Encounter  Procedures   Hemoglobin A1c   VITAMIN D  25 Hydroxy (Vit-D Deficiency, Fractures)   No orders of the defined types were placed in this encounter.     Immunization History  Administered Date(s) Administered   Influenza Inj Mdck Quad Pf 02/14/2018   Influenza, Seasonal, Injecte, Preservative Fre 01/31/2023   Influenza,inj,Quad PF,6+ Mos 02/02/2017, 02/04/2019, 02/24/2020, 02/15/2021, 03/13/2022, 01/12/2024   Moderna Covid-19 Fall Seasonal Vaccine 38yrs & older 01/31/2023   PFIZER Comirnaty(Gray Top)Covid-19 Tri-Sucrose Vaccine 08/19/2020   PFIZER(Purple Top)SARS-COV-2  Vaccination 07/04/2019, 07/25/2019, 03/11/2020   PNEUMOCOCCAL CONJUGATE-20 06/23/2023   Pfizer(Comirnaty)Fall Seasonal Vaccine 12 years and older 01/05/2024   Tdap 09/30/2017   Zoster Recombinant(Shingrix )  08/17/2019, 10/27/2019    Diabetes Related Lab Review: Lab Results  Component Value Date   HGBA1C 7.6 (H) 12/24/2023   HGBA1C 6.8 (A) 09/25/2023   HGBA1C 8.1 (A) 06/23/2023    Lab Results  Component Value Date   MICROALBUR 1.4 12/24/2023   Lab Results  Component Value Date   CREATININE 0.73 12/24/2023   BUN 17 12/24/2023   NA 140 12/24/2023   K 4.4 12/24/2023   CL 103 12/24/2023   CO2 26 12/24/2023   Lab Results  Component Value Date   CHOL 166 12/24/2023   CHOL 174 03/18/2023   CHOL 271 (H) 12/13/2022   Lab Results  Component Value Date   HDL 57.30 12/24/2023   HDL 58.80 03/18/2023   HDL 50.90 12/13/2022   Lab Results  Component Value Date   LDLCALC 70 12/24/2023   LDLCALC 78 03/18/2023   LDLCALC 181 (H) 12/13/2022   Lab Results  Component Value Date   TRIG 197.0 (H) 12/24/2023   TRIG 186.0 (H) 03/18/2023   TRIG 195.0 (H) 12/13/2022   Lab Results  Component Value Date   CHOLHDL 3 12/24/2023   CHOLHDL 3 03/18/2023   CHOLHDL 5 12/13/2022   Lab Results  Component Value Date   LDLDIRECT 183.0 12/12/2021   LDLDIRECT 110.0 10/04/2020   The 10-year ASCVD risk score (Arnett DK, et al., 2019) is: 2.8%   Values used to calculate the score:     Age: 69 years     Clincally relevant sex: Female     Is Non-Hispanic African American: No     Diabetic: Yes     Tobacco smoker: No     Systolic Blood Pressure: 110 mmHg     Is BP treated: No     HDL Cholesterol: 57.3 mg/dL     Total Cholesterol: 166 mg/dL I have reviewed the PMH, Fam and Soc history. Patient Active Problem List   Diagnosis Date Noted   Class 1 obesity due to excess calories with serious comorbidity and body mass index (BMI) of 30.0 to 30.9 in adult 12/13/2022    Priority: High   Controlled type 2 diabetes mellitus without complication, without long-term current use of insulin (HCC) 05/01/2022    Priority: High    A1c 6.29 September 2020, 7.27 November 2021, 7.04 May 2022: 1st visit for diabetes.  Met xr  750 daily, increased to 1500mg  daily 02/2023; pt defers 2nd med at this time.  05/2023: added farxiga  10 to metformin  xr 1500mg  daily.  Normotensive not on ace.     Combined hyperlipidemia associated with type 2 diabetes mellitus (HCC) 05/01/2022    Priority: High    Crestor  10 11/2022    Relapsing remitting multiple sclerosis 06/07/2021    Priority: High   Widowed 06/2020 11/16/2020    Priority: High   Migraine without aura 06/01/2020    Priority: High   Postherpetic neuralgia 06/01/2020    Priority: High   Vitamin D  deficiency 10/07/2016    Priority: Low    Social History: Patient  reports that she has never smoked. She has never used smokeless tobacco. She reports that she does not drink alcohol and does not use drugs.  Review of Systems: Ophthalmic: negative for eye pain, loss of vision or double vision Cardiovascular: negative for chest pain Respiratory: negative for SOB or persistent  cough Gastrointestinal: negative for abdominal pain Genitourinary: negative for dysuria or gross hematuria MSK: negative for foot lesions Neurologic: negative for weakness or gait disturbance  Objective  Vitals: BP 110/72   Pulse 85   Temp 97.7 F (36.5 C)   Ht 5' 2 (1.575 m)   Wt 158 lb (71.7 kg)   SpO2 96%   BMI 28.90 kg/m  General: well appearing, no acute distress  Psych:  Alert and oriented, normal mood and affect  Diabetic education: ongoing education regarding chronic disease management for diabetes was given today. We continue to reinforce the ABC's of diabetic management: A1c (<7 or 8 dependent upon patient), tight blood pressure control, and cholesterol management with goal LDL < 100 minimally. We discuss diet strategies, exercise recommendations, medication options and possible side effects. At each visit, we review recommended immunizations and preventive care recommendations for diabetics and stress that good diabetic control can prevent other problems. See below for this  patient's data. Commons side effects, risks, benefits, and alternatives for medications and treatment plan prescribed today were discussed, and the patient expressed understanding of the given instructions. Patient is instructed to call or message via MyChart if he/she has any questions or concerns regarding our treatment plan. No barriers to understanding were identified. We discussed Red Flag symptoms and signs in detail. Patient expressed understanding regarding what to do in case of urgent or emergency type symptoms.  Medication list was reconciled, printed and provided to the patient in AVS. Patient instructions and summary information was reviewed with the patient as documented in the AVS. This note was prepared with assistance of Dragon voice recognition software. Occasional wrong-word or sound-a-like substitutions may have occurred due to the inherent limitations of voice recognition software

## 2024-04-01 NOTE — Patient Instructions (Signed)
 Please return in 3 mo for diabetes recheck if adding meds, otherwise 6 mo for dm recheck  I will release your lab results to you on your MyChart account with further instructions. You may see the results before I do, but when I review them I will send you a message with my report or have my assistant call you if things need to be discussed. Please reply to my message with any questions. Thank you!   If you have any questions or concerns, please don't hesitate to send me a message via MyChart or call the office at [phone]. Thank you for visiting with us  today! It's our pleasure caring for you.    VISIT SUMMARY: During your visit, we discussed your type 2 diabetes management and vitamin D  deficiency. Your A1c levels are higher than expected, and we reviewed your current medications and dietary efforts. We also addressed your vitamin D  supplementation.  YOUR PLAN: -TYPE 2 DIABETES MELLITUS WITH HYPERGLYCEMIA: Type 2 diabetes is a condition where your body does not use insulin properly, leading to high blood sugar levels. Your A1c levels are elevated, indicating that your blood sugar is not well controlled. We will continue your current medications, Farxiga  and metformin , and have ordered a blood test to check your A1c and vitamin D  levels. If your A1c remains above 7, we may increase your metformin  dose if you can tolerate it. If your A1c is in the 7s, we will discuss starting GLP-1 receptor agonist therapy, which can help improve your blood sugar control, aid in weight loss, and provide cardiovascular benefits. We will communicate your lab results via MyChart and schedule a follow-up in 3 months to reassess your diabetes management.  -VITAMIN D  DEFICIENCY: Vitamin D  deficiency means you have low levels of vitamin D , which is important for bone health and overall well-being. You are currently taking vitamin D  supplements, but we need to verify the exact dosage. We have ordered a blood test to check  your vitamin D  levels and will adjust your dosage if necessary to ensure you are getting the right amount.  INSTRUCTIONS: We have ordered blood tests to check your A1c and vitamin D  levels. Please continue taking your current medications and vitamin D  supplements as discussed. We will communicate your lab results via MyChart. Your next follow-up appointment is scheduled in 3 months to reassess your diabetes management.                      Contains text generated by [employer].                                 Contains text generated by [employer].

## 2024-04-02 ENCOUNTER — Ambulatory Visit: Payer: Self-pay | Admitting: Family Medicine

## 2024-04-02 NOTE — Progress Notes (Signed)
 See mychart note Dear Ms. Meras, Good news: your A1c has improved. It is near goal.  I will let you decide: we can increase your metformin  dose or switch you to a GLP-1 to manage your diabetes and also help with weight loss. Let me know. I'd love to switch you as I think it will be more beneficial and will push your A1c much lower. Your vit D is also improved. You can double your current dose of Vit d daily supplements to push higher.   Sincerely, Dr. Jodie

## 2024-06-28 ENCOUNTER — Ambulatory Visit: Admitting: Family Medicine

## 2024-12-27 ENCOUNTER — Encounter: Admitting: Family Medicine
